# Patient Record
Sex: Male | Born: 1975 | Race: Black or African American | Hispanic: No | Smoking: Current every day smoker
Health system: Southern US, Community
[De-identification: ages and names within clinical notes are randomized; demographics above are authoritative.]

## PROBLEM LIST (undated history)

## (undated) DIAGNOSIS — K649 Unspecified hemorrhoids: Secondary | ICD-10-CM

## (undated) DIAGNOSIS — K219 Gastro-esophageal reflux disease without esophagitis: Secondary | ICD-10-CM

---

## 2014-05-26 ENCOUNTER — Emergency Department: Payer: Self-pay | Admitting: Emergency Medicine

## 2014-08-01 ENCOUNTER — Emergency Department: Payer: Self-pay | Admitting: Emergency Medicine

## 2014-08-02 ENCOUNTER — Emergency Department: Payer: Self-pay | Admitting: Emergency Medicine

## 2014-08-15 DIAGNOSIS — T24299A Burn of second degree of multiple sites of unspecified lower limb, except ankle and foot, initial encounter: Secondary | ICD-10-CM | POA: Insufficient documentation

## 2015-01-03 ENCOUNTER — Emergency Department: Payer: Self-pay | Admitting: Emergency Medicine

## 2015-01-03 LAB — CBC WITH DIFFERENTIAL/PLATELET
BASOS ABS: 0.1 10*3/uL (ref 0.0–0.1)
Basophil %: 1.2 %
EOS ABS: 0.1 10*3/uL (ref 0.0–0.7)
Eosinophil %: 1.2 %
HCT: 51.8 % (ref 40.0–52.0)
HGB: 16.7 g/dL (ref 13.0–18.0)
LYMPHS ABS: 3.5 10*3/uL (ref 1.0–3.6)
Lymphocyte %: 30.3 %
MCH: 30.2 pg (ref 26.0–34.0)
MCHC: 32.3 g/dL (ref 32.0–36.0)
MCV: 94 fL (ref 80–100)
MONO ABS: 1.2 x10 3/mm — AB (ref 0.2–1.0)
Monocyte %: 10.4 %
NEUTROS PCT: 56.9 %
Neutrophil #: 6.6 10*3/uL — ABNORMAL HIGH (ref 1.4–6.5)
Platelet: 224 10*3/uL (ref 150–440)
RBC: 5.53 10*6/uL (ref 4.40–5.90)
RDW: 13.8 % (ref 11.5–14.5)
WBC: 11.6 10*3/uL — ABNORMAL HIGH (ref 3.8–10.6)

## 2015-01-03 LAB — COMPREHENSIVE METABOLIC PANEL
ALBUMIN: 3.4 g/dL (ref 3.4–5.0)
ALT: 31 U/L
Alkaline Phosphatase: 57 U/L
Anion Gap: 6 — ABNORMAL LOW (ref 7–16)
BILIRUBIN TOTAL: 0.8 mg/dL (ref 0.2–1.0)
BUN: 9 mg/dL (ref 7–18)
CO2: 27 mmol/L (ref 21–32)
CREATININE: 1.43 mg/dL — AB (ref 0.60–1.30)
Calcium, Total: 9 mg/dL (ref 8.5–10.1)
Chloride: 107 mmol/L (ref 98–107)
EGFR (African American): >60
GFR CALC NON AF AMER: 59 — AB
Glucose: 87 mg/dL (ref 65–99)
OSMOLALITY: 277 (ref 275–301)
Potassium: 3.4 mmol/L — ABNORMAL LOW (ref 3.5–5.1)
SGOT(AST): 52 U/L — ABNORMAL HIGH (ref 15–37)
SODIUM: 140 mmol/L (ref 136–145)
TOTAL PROTEIN: 7.3 g/dL (ref 6.4–8.2)

## 2015-02-22 DIAGNOSIS — Z8719 Personal history of other diseases of the digestive system: Secondary | ICD-10-CM | POA: Insufficient documentation

## 2015-03-10 ENCOUNTER — Ambulatory Visit: Payer: Self-pay | Admitting: Unknown Physician Specialty

## 2015-03-20 DIAGNOSIS — K3189 Other diseases of stomach and duodenum: Secondary | ICD-10-CM | POA: Insufficient documentation

## 2015-03-20 DIAGNOSIS — K2980 Duodenitis without bleeding: Secondary | ICD-10-CM | POA: Insufficient documentation

## 2015-04-24 LAB — SURGICAL PATHOLOGY

## 2015-08-01 ENCOUNTER — Emergency Department
Admission: EM | Admit: 2015-08-01 | Discharge: 2015-08-01 | Disposition: A | Discharge status: Home / Self Care | Payer: Medicare Other | Attending: Emergency Medicine | Admitting: Emergency Medicine

## 2015-08-01 ENCOUNTER — Emergency Department: Payer: Medicare Other

## 2015-08-01 DIAGNOSIS — Z720 Tobacco use: Secondary | ICD-10-CM | POA: Diagnosis not present

## 2015-08-01 DIAGNOSIS — Y9389 Activity, other specified: Secondary | ICD-10-CM | POA: Insufficient documentation

## 2015-08-01 DIAGNOSIS — Y998 Other external cause status: Secondary | ICD-10-CM | POA: Diagnosis not present

## 2015-08-01 DIAGNOSIS — S9782XA Crushing injury of left foot, initial encounter: Secondary | ICD-10-CM | POA: Insufficient documentation

## 2015-08-01 DIAGNOSIS — W208XXA Other cause of strike by thrown, projected or falling object, initial encounter: Secondary | ICD-10-CM | POA: Insufficient documentation

## 2015-08-01 DIAGNOSIS — Y9289 Other specified places as the place of occurrence of the external cause: Secondary | ICD-10-CM | POA: Insufficient documentation

## 2015-08-01 DIAGNOSIS — M7752 Other enthesopathy of left foot: Secondary | ICD-10-CM | POA: Diagnosis not present

## 2015-08-01 DIAGNOSIS — M779 Enthesopathy, unspecified: Secondary | ICD-10-CM

## 2015-08-01 DIAGNOSIS — S99922A Unspecified injury of left foot, initial encounter: Secondary | ICD-10-CM | POA: Diagnosis present

## 2015-08-01 MED ORDER — MELOXICAM 15 MG PO TABS: 15.0000 mg | ORAL_TABLET | Freq: Every day | ORAL | Status: DC

## 2015-08-01 MED ORDER — HYDROCODONE-ACETAMINOPHEN 5-325 MG PO TABS: 1.0000 | ORAL_TABLET | ORAL | Status: DC | PRN

## 2015-08-01 NOTE — ED Notes (Signed)
Pt reports to ED w/ c/o foot pain.  Pt sts he dropped dryer on foot today.  Pt able to ambulate to triage room.  Pt also c/o hand cramping.

## 2015-08-01 NOTE — ED Provider Notes (Signed)
[facility] Emergency Department Provider Note ____________________________________________  Time seen: Approximately 6:25 PM  I have reviewed the triage vital signs and the nursing notes.   HISTORY  Chief Complaint Foot Injury   HPI Peter Kim is a 39 y.o. male who presents to the urgency department for evaluation of left foot pain after a friend accidentally dropped a washer while they were moving it. He also reports concerns because he is having muscle cramps and stiffness in his hands and legs most pronounced when he wakes up in the mornings. He reports that he started a new job about 3 weeks ago and is making repetitive motions. He has not been taking anything at home for pain.   History reviewed. No pertinent past medical history.  There are no active problems to display for this patient.   History reviewed. No pertinent past surgical history.  No current outpatient prescriptions on file.  Allergies Review of patient's allergies indicates no known allergies.  No family history on file.  Social History History  Substance Use Topics  . Smoking status: Current Every Day Smoker -- 0.50 packs/day    Types: Cigarettes  . Smokeless tobacco: Not on file  . Alcohol Use: Yes     Comment: occassionally     Review of Systems Constitutional: No recent illness. Eyes: No visual changes. ENT: No sore throat. Cardiovascular: Denies chest pain or palpitations. Respiratory: Denies shortness of breath. Gastrointestinal: No abdominal pain.  Genitourinary: Negative for dysuria. Musculoskeletal: Pain in left foot, bilateral hands. Skin: Negative for rash. Neurological: Negative for headaches, focal weakness or numbness. 10-point ROS otherwise negative.  ____________________________________________   PHYSICAL EXAM:  VITAL SIGNS: ED Triage Vitals  Enc Vitals Group     BP 08/01/15 1720 134/89 mmHg     Pulse Rate 08/01/15 1720 86     Resp 08/01/15  1720 14     Temp 08/01/15 1720 97.9 F (36.6 C)     Temp Source 08/01/15 1720 Oral     SpO2 08/01/15 1720 98 %     Weight 08/01/15 1720 230 lb (104.327 kg)     Height 08/01/15 1720  (1.854 m)     Head Cir --      Peak Flow --      Pain Score 08/01/15 1721 8     Pain Loc --      Pain Edu? --      Excl. in GC? --     Constitutional: Alert and oriented. Well appearing and in no acute distress. Eyes: Conjunctivae are normal. EOMI. Head: Atraumatic. Nose: No congestion/rhinnorhea. Neck: No stridor.  Respiratory: Normal respiratory effort.   Musculoskeletal: Erythema noted to the top of the left foot. Full range of motion possible but somewhat limited due to pain. Full range of motion noted to bilateral hands. Neurologic:  Normal speech and language. No gross focal neurologic deficits are appreciated. Speech is normal. No gait instability. Skin:  Skin is warm, dry and intact. Small abrasion noted to the left ankle. Psychiatric: Mood and affect are normal. Speech and behavior are normal.  ____________________________________________   LABS (all labs ordered are listed, but only abnormal results are displayed)  Labs Reviewed - No data to display ____________________________________________  RADIOLOGY  Negative for acute pathology. ____________________________________________   PROCEDURES  Procedure(s) performed: Ace bandage applied to the left foot by RN.   ____________________________________________   INITIAL IMPRESSION / ASSESSMENT AND PLAN / ED COURSE  Pertinent labs & imaging results that were  available during my care of the patient were reviewed by me and considered in my medical decision making (see chart for details).  Patient was advised to follow-up with orthopedics for symptoms that are not improving over the next few days. He was advised to take meloxicam daily to help with the joint stiffness. He is advised to return to the emergency department for  symptoms that change or worsen if he is unable schedule an appointment. ____________________________________________   FINAL CLINICAL IMPRESSION(S) / ED DIAGNOSES  Final diagnoses:  None       Chinita Pester, FNP 08/01/15 1839  Minna Antis, MD 08/01/15 2229

## 2015-08-09 ENCOUNTER — Emergency Department
Admission: EM | Admit: 2015-08-09 | Discharge: 2015-08-09 | Disposition: A | Discharge status: Home / Self Care | Payer: Medicare Other | Attending: Emergency Medicine | Admitting: Emergency Medicine

## 2015-08-09 ENCOUNTER — Emergency Department: Payer: Medicare Other

## 2015-08-09 DIAGNOSIS — M79642 Pain in left hand: Secondary | ICD-10-CM | POA: Insufficient documentation

## 2015-08-09 DIAGNOSIS — Z791 Long term (current) use of non-steroidal anti-inflammatories (NSAID): Secondary | ICD-10-CM | POA: Diagnosis not present

## 2015-08-09 DIAGNOSIS — Z720 Tobacco use: Secondary | ICD-10-CM | POA: Insufficient documentation

## 2015-08-09 MED ORDER — PREDNISONE 10 MG PO TABS: ORAL_TABLET | ORAL | Status: DC

## 2015-08-09 MED ORDER — OXYCODONE-ACETAMINOPHEN 5-325 MG PO TABS: 1.0000 | ORAL_TABLET | ORAL | Status: DC | PRN

## 2015-08-09 MED ORDER — METHOCARBAMOL 500 MG PO TABS: 500.0000 mg | ORAL_TABLET | Freq: Four times a day (QID) | ORAL | Status: DC | PRN

## 2015-08-09 MED ORDER — OXYCODONE-ACETAMINOPHEN 5-325 MG PO TABS
2.0000 | ORAL_TABLET | Freq: Once | ORAL | Status: AC
  Administered 2015-08-09: 2 via ORAL
  Filled 2015-08-09: qty 2

## 2015-08-09 NOTE — Discharge Instructions (Signed)

## 2015-08-09 NOTE — ED Notes (Signed)
Pt with pain to right hand for over a week, seen here last week for the same and dx with tendonitis.  Given meloxicam without any relief.

## 2015-08-09 NOTE — ED Provider Notes (Signed)
[facility] Emergency Department Provider Note  ____________________________________________  Time seen: Approximately 7:48 PM  I have reviewed the triage vital signs and the nursing notes.   HISTORY  Chief Complaint Hand Pain    HPI Peter Kim is a 39 y.o. male presents for evaluation of continued right hand pain times several weeks. Patient was seen here on 08/01/2015 for the same. 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  No past medical history on file.  There are no active problems to display for this patient.   No past surgical history on file.  Current Outpatient Rx  Name  Route  Sig  Dispense  Refill  . meloxicam (MOBIC) 15 MG tablet   Oral   Take 1 tablet (15 mg total) by mouth daily.   30 tablet   2   . methocarbamol (ROBAXIN) 500 MG tablet   Oral   Take 1 tablet (500 mg total) by mouth every 6 (six) hours as needed for muscle spasms.   30 tablet   0   . oxyCODONE-acetaminophen (ROXICET) 5-325 MG per tablet   Oral   Take 1-2 tablets by mouth every 4 (four) hours as needed for severe pain.   15 tablet   0   . predniSONE (DELTASONE) 10 MG tablet      Take 5 pills daily for 5 days.   25 tablet   0     Allergies Review of patient's allergies indicates no known allergies.  No family history on file.  Social History Social History  Substance Use Topics  . Smoking status: Current Every Day Smoker -- 0.50 packs/day    Types: Cigarettes  . Smokeless tobacco: Not on file  . Alcohol Use: Yes     Comment: occassionally     Review of Systems Constitutional: No fever/chills Eyes: No visual changes. ENT: No sore throat. Cardiovascular: Denies chest pain. Respiratory: Denies shortness of breath. Gastrointestinal: No abdominal pain.  No nausea, no vomiting.  No diarrhea.  No constipation. Genitourinary: Negative for dysuria. Musculoskeletal: Positive  for right hand pain. Skin: Negative for rash. Neurological: Negative for headaches, focal weakness or numbness.  10-point ROS otherwise negative.  ____________________________________________   PHYSICAL EXAM:  VITAL SIGNS: ED Triage Vitals  Enc Vitals Group     BP 08/09/15 1937 155/92 mmHg     Pulse Rate 08/09/15 1937 89     Resp 08/09/15 1937 18     Temp 08/09/15 1937 98.4 F (36.9 C)     Temp src --      SpO2 --      Weight 08/09/15 1937 230 lb (104.327 kg)     Height 08/09/15 1937  (1.854 m)     Head Cir --      Peak Flow --      Pain Score 08/09/15 1938 10     Pain Loc --      Pain Edu? --      Excl. in GC? --     Constitutional: Alert and oriented. Well appearing and in no acute distress. Neck: No stridor.   Cardiovascular: Normal rate, regular rhythm. Grossly normal heart sounds.  Good peripheral circulation. Respiratory: Normal respiratory effort.  No retractions. Lungs CTAB. Musculoskeletal: Obvious right hand edema with tenderness. Range of motion of the thumb in flexion position. Decreased strength noted. Neurologic:  Normal speech and language. No gross focal neurologic deficits are appreciated. No gait instability. Skin:  Skin is warm, dry and intact. No rash noted. Psychiatric: Mood and affect are normal. Speech and behavior are normal.  ____________________________________________   LABS (all labs ordered are listed, but only abnormal results are displayed)  Labs Reviewed - No data to display ____________________________________________  RADIOLOGY  X-rays unremarkable interpreted by radiologist and reviewed by myself. ____________________________________________   PROCEDURES  Procedure(s) performed: None  Critical Care performed: No  ____________________________________________   INITIAL IMPRESSION / ASSESSMENT AND PLAN / ED COURSE  Pertinent labs & imaging results that were available during my care of the patient were reviewed by me  and considered in my medical decision making (see chart for details).  Ace wrap to right hand. Rx given for naproxen 500 mg, Robaxin 500 mg prednisone 50 mg daily 5 days and Percocet for severe pain only. Patient follow-up with Dr. Hyacinth Meeker in orthopedics if continued pain. Patient voices no other emergency medical complaints at this time. ____________________________________________   FINAL CLINICAL IMPRESSION(S) / ED DIAGNOSES  Final diagnoses:  Hand pain, left      Evangeline Dakin, PA-C 08/09/15 2029  Sharman Cheek, MD 08/09/15 2244

## 2015-10-28 ENCOUNTER — Encounter: Payer: Self-pay | Admitting: Emergency Medicine

## 2015-10-28 ENCOUNTER — Emergency Department
Admission: EM | Admit: 2015-10-28 | Discharge: 2015-10-28 | Disposition: A | Discharge status: Home / Self Care | Payer: Medicare Other | Attending: Emergency Medicine | Admitting: Emergency Medicine

## 2015-10-28 DIAGNOSIS — K5900 Constipation, unspecified: Secondary | ICD-10-CM | POA: Diagnosis not present

## 2015-10-28 DIAGNOSIS — K641 Second degree hemorrhoids: Secondary | ICD-10-CM | POA: Insufficient documentation

## 2015-10-28 DIAGNOSIS — Z791 Long term (current) use of non-steroidal anti-inflammatories (NSAID): Secondary | ICD-10-CM | POA: Diagnosis not present

## 2015-10-28 DIAGNOSIS — Z720 Tobacco use: Secondary | ICD-10-CM | POA: Insufficient documentation

## 2015-10-28 DIAGNOSIS — K6289 Other specified diseases of anus and rectum: Secondary | ICD-10-CM | POA: Diagnosis present

## 2015-10-28 HISTORY — DX: Unspecified hemorrhoids: K64.9

## 2015-10-28 MED ORDER — OXYCODONE-ACETAMINOPHEN 7.5-325 MG PO TABS: 1.0000 | ORAL_TABLET | ORAL | Status: DC | PRN

## 2015-10-28 MED ORDER — HYDROCORTISONE ACETATE 25 MG RE SUPP: 25.0000 mg | Freq: Two times a day (BID) | RECTAL | Status: DC

## 2015-10-28 MED ORDER — HYDROCORTISONE ACETATE 25 MG RE SUPP
25.0000 mg | Freq: Once | RECTAL | Status: AC
  Administered 2015-10-28: 25 mg via RECTAL
  Filled 2015-10-28: qty 1

## 2015-10-28 MED ORDER — OXYCODONE-ACETAMINOPHEN 5-325 MG PO TABS
1.0000 | ORAL_TABLET | Freq: Once | ORAL | Status: AC
  Administered 2015-10-28: 1 via ORAL
  Filled 2015-10-28: qty 1

## 2015-10-28 NOTE — Discharge Instructions (Signed)
Hemorrhoids °Hemorrhoids are swollen veins around the rectum or anus. There are two types of hemorrhoids:  °· Internal hemorrhoids. These occur in the veins just inside the rectum. They may poke through to the outside and become irritated and painful. °· External hemorrhoids. These occur in the veins outside the anus and can be felt as a painful swelling or hard lump near the anus. °CAUSES °· Pregnancy.   °· Obesity.   °· Constipation or diarrhea.   °· Straining to have a bowel movement.   °· Sitting for long periods on the toilet. °· Heavy lifting or other activity that caused you to strain. °· Anal intercourse. °SYMPTOMS  °· Pain.   °· Anal itching or irritation.   °· Rectal bleeding.   °· Fecal leakage.   °· Anal swelling.   °· One or more lumps around the anus.   °DIAGNOSIS  °Your caregiver may be able to diagnose hemorrhoids by visual examination. Other examinations or tests that may be performed include:  °· Examination of the rectal area with a gloved hand (digital rectal exam).   °· Examination of anal canal using a small tube (scope).   °· A blood test if you have lost a significant amount of blood. °· A test to look inside the colon (sigmoidoscopy or colonoscopy). °TREATMENT °Most hemorrhoids can be treated at home. However, if symptoms do not seem to be getting better or if you have a lot of rectal bleeding, your caregiver may perform a procedure to help make the hemorrhoids get smaller or remove them completely. Possible treatments include:  °· Placing a rubber band at the base of the hemorrhoid to cut off the circulation (rubber band ligation).   °· Injecting a chemical to shrink the hemorrhoid (sclerotherapy).   °· Using a tool to burn the hemorrhoid (infrared light therapy).   °· Surgically removing the hemorrhoid (hemorrhoidectomy).   °· Stapling the hemorrhoid to block blood flow to the tissue (hemorrhoid stapling).   °HOME CARE INSTRUCTIONS  °· Eat foods with fiber, such as whole grains, beans,  nuts, fruits, and vegetables. Ask your doctor about taking products with added fiber in them (fiber supplements). °· Increase fluid intake. Drink enough water and fluids to keep your urine clear or pale yellow.   °· Exercise regularly.   °· Go to the bathroom when you have the urge to have a bowel movement. Do not wait.   °· Avoid straining to have bowel movements.   °· Keep the anal area dry and clean. Use wet toilet paper or moist towelettes after a bowel movement.   °· Medicated creams and suppositories may be used or applied as directed.   °· Only take over-the-counter or prescription medicines as directed by your caregiver.   °· Take warm sitz baths for 15-20 minutes, 3-4 times a day to ease pain and discomfort.   °· Place ice packs on the hemorrhoids if they are tender and swollen. Using ice packs between sitz baths may be helpful.   °¨ Put ice in a plastic bag.   °¨ Place a towel between your skin and the bag.   °¨ Leave the ice on for 15-20 minutes, 3-4 times a day.   °· Do not use a donut-shaped pillow or sit on the toilet for long periods. This increases blood pooling and pain.   °SEEK MEDICAL CARE IF: °· You have increasing pain and swelling that is not controlled by treatment or medicine. °· You have uncontrolled bleeding. °· You have difficulty or you are unable to have a bowel movement. °· You have pain or inflammation outside the area of the hemorrhoids. °MAKE SURE YOU: °· Understand these instructions. °·   Will watch your condition.  Will get help right away if you are not doing well or get worse.   This information is not intended to replace advice given to you by your health care provider. Make sure you discuss any questions you have with your health care provider.   Document Released: 12/13/2000 Document Revised: 12/02/2012 Document Reviewed: 10/20/2012 Elsevier Interactive Patient Education 2016 [employer]   Continue pain medicine and rectal suppository as directed.   If not  improving, contact your primary physician or the surgeon. Return to the emergency room for any concerns.

## 2015-10-28 NOTE — ED Notes (Signed)
C/o burning pain to rectum x 2 days.  States he sees blood when wiping.  Has been using preparation H and taking Ibuprofen for pain with no relief.  Does have history of hemorrhoids, but these feel worse.

## 2015-10-29 NOTE — ED Provider Notes (Signed)
[facility] Emergency Department Provider Note  ____________________________________________  Time seen: Approximately 12:31 AM  I have reviewed the triage vital signs and the nursing notes.   HISTORY  Chief Complaint Hemorrhoids and Rectal Pain    HPI Darryn Kydd is a 39 y.o. male with rectal pain for 2 days. He has had to strain due to constipation. No abdominal pain. He has a history of reflux.  he has seenblood on tissue when wiping. No lightheadedness.   Past Medical History  Diagnosis Date  . Hemorrhoid     There are no active problems to display for this patient.   History reviewed. No pertinent past surgical history.  Current Outpatient Rx  Name  Route  Sig  Dispense  Refill  . hydrocortisone (ANUSOL-HC) 25 MG suppository   Rectal   Place 1 suppository (25 mg total) rectally every 12 (twelve) hours.   12 suppository   1   . meloxicam (MOBIC) 15 MG tablet   Oral   Take 1 tablet (15 mg total) by mouth daily.   30 tablet   2   . methocarbamol (ROBAXIN) 500 MG tablet   Oral   Take 1 tablet (500 mg total) by mouth every 6 (six) hours as needed for muscle spasms.   30 tablet   0   . oxyCODONE-acetaminophen (PERCOCET) 7.5-325 MG tablet   Oral   Take 1 tablet by mouth every 4 (four) hours as needed for severe pain.   20 tablet   0   . oxyCODONE-acetaminophen (ROXICET) 5-325 MG per tablet   Oral   Take 1-2 tablets by mouth every 4 (four) hours as needed for severe pain.   15 tablet   0   . predniSONE (DELTASONE) 10 MG tablet      Take 5 pills daily for 5 days.   25 tablet   0     Allergies Review of patient's allergies indicates no known allergies.  No family history on file.  Social History Social History  Substance Use Topics  . Smoking status: Current Every Day Smoker -- 0.50 packs/day    Types: Cigarettes  . Smokeless tobacco: None  . Alcohol Use: Yes     Comment: occassionally     Review of  Systems  Constitutional: No fever/chills Eyes: No visual changes. ENT: No sore throat. Cardiovascular: Denies chest pain. Respiratory: Denies shortness of breath. Gastrointestinal: No abdominal pain.  No nausea, no vomiting.  No diarrhea.  Positive for constipation. Genitourinary: Negative for dysuria. Musculoskeletal: Negative for back pain. Skin: Negative for rash. Neurological: Negative for headaches, focal weakness or numbness.  10-point ROS otherwise negative.  ____________________________________________   PHYSICAL EXAM:  VITAL SIGNS: ED Triage Vitals  Enc Vitals Group     BP 10/28/15 2058 137/96 mmHg     Pulse Rate 10/28/15 2058 92     Resp 10/28/15 2058 18     Temp 10/28/15 2058 97.5 F (36.4 C)     Temp Source 10/28/15 2058 Oral     SpO2 10/28/15 2058 97 %     Weight 10/28/15 2058 230 lb (104.327 kg)     Height 10/28/15 2058  (1.854 m)     Head Cir --      Peak Flow --      Pain Score 10/28/15 2117 10     Pain Loc --      Pain Edu? --      Excl. in GC? --     Constitutional:  Alert and oriented. Well appearing and in no acute distress. Eyes: Conjunctivae are normal. PERRL. EOMI. Head: Atraumatic. Nose: No congestion/rhinnorhea. Mouth/Throat: Mucous membranes are moist.  Oropharynx non-erythematous. Neck: No stridor.  Cardiovascular: Normal rate, regular rhythm. Grossly normal heart sounds.  Good peripheral circulation. Respiratory: Normal respiratory effort.  No retractions. Lungs CTAB. Gastrointestinal: Soft and nontender. No distention. No abdominal bruits. No CVA tenderness. RECTUM:  Hemorrhoid noted, non thrombosed at about 9 oclock. Musculoskeletal: No lower extremity tenderness nor edema.  No joint effusions. Neurologic:  Normal speech and language. No gross focal neurologic deficits are appreciated. No gait instability. Skin:  Skin is warm, dry and intact. No rash noted. Psychiatric: Mood and affect are normal. Speech and behavior are  normal.  ____________________________________________   LABS (all labs ordered are listed, but only abnormal results are displayed)  Labs Reviewed - No data to display ____________________________________________  EKG   ____________________________________________  RADIOLOGY   ____________________________________________   PROCEDURES  Procedure(s) performed: None  Critical Care performed: No  ____________________________________________   INITIAL IMPRESSION / ASSESSMENT AND PLAN / ED COURSE  Pertinent labs & imaging results that were available during my care of the patient were reviewed by me and considered in my medical decision making (see chart for details).  39 year old male with tender hemorrhoid, nonthrombosed. Given Anusol HC suppositories and Percocet for pain control. Work note given. He can follow-up with primary physician if not improving or contacting the surgeon. He will return to the emergency room for any concerns. ____________________________________________   FINAL CLINICAL IMPRESSION(S) / ED DIAGNOSES  Final diagnoses:  Second degree hemorrhoids      Ignacia Bayleyobert Renate Danh, PA-C 10/29/15 [id number]  Jene Everyobert Kinner, MD 10/29/15 [phone]

## 2015-11-14 ENCOUNTER — Emergency Department: Payer: Medicare Other

## 2015-11-14 ENCOUNTER — Emergency Department
Admission: EM | Admit: 2015-11-14 | Discharge: 2015-11-14 | Disposition: A | Discharge status: Home / Self Care | Payer: Medicare Other | Attending: Emergency Medicine | Admitting: Emergency Medicine

## 2015-11-14 ENCOUNTER — Encounter: Payer: Self-pay | Admitting: *Deleted

## 2015-11-14 DIAGNOSIS — W108XXA Fall (on) (from) other stairs and steps, initial encounter: Secondary | ICD-10-CM | POA: Diagnosis not present

## 2015-11-14 DIAGNOSIS — Y9389 Activity, other specified: Secondary | ICD-10-CM | POA: Insufficient documentation

## 2015-11-14 DIAGNOSIS — Y998 Other external cause status: Secondary | ICD-10-CM | POA: Diagnosis not present

## 2015-11-14 DIAGNOSIS — Z7952 Long term (current) use of systemic steroids: Secondary | ICD-10-CM | POA: Diagnosis not present

## 2015-11-14 DIAGNOSIS — S8001XA Contusion of right knee, initial encounter: Secondary | ICD-10-CM | POA: Insufficient documentation

## 2015-11-14 DIAGNOSIS — Z79899 Other long term (current) drug therapy: Secondary | ICD-10-CM | POA: Diagnosis not present

## 2015-11-14 DIAGNOSIS — Y9289 Other specified places as the place of occurrence of the external cause: Secondary | ICD-10-CM | POA: Diagnosis not present

## 2015-11-14 DIAGNOSIS — S8991XA Unspecified injury of right lower leg, initial encounter: Secondary | ICD-10-CM | POA: Diagnosis present

## 2015-11-14 DIAGNOSIS — F17210 Nicotine dependence, cigarettes, uncomplicated: Secondary | ICD-10-CM | POA: Diagnosis not present

## 2015-11-14 DIAGNOSIS — S80811A Abrasion, right lower leg, initial encounter: Secondary | ICD-10-CM | POA: Insufficient documentation

## 2015-11-14 MED ORDER — MELOXICAM 15 MG PO TABS: 15.0000 mg | ORAL_TABLET | Freq: Every day | ORAL | Status: DC

## 2015-11-14 NOTE — Discharge Instructions (Signed)
Periosteal Hematoma Periosteal hematoma (bone bruise) is a localized, tender, raised area close to the bone. It can occur from a small hidden fracture of the bone, following surgery, or from other trauma to the area. It typically occurs in bones located close to the surface of the skin, such as the shin, knee, and heel bone. Although it may take 2 or more weeks to completely heal, bone bruises typically are not associated with permanent or serious damage to the bone. If you are taking blood thinners, you may be at greater risk for such injuries.  CAUSES  A bone bruise is usually caused by high-impact trauma to the bone, but it can be caused by sports injuries or twisting injuries. SIGNS AND SYMPTOMS   Severe pain around the injured area that typically lasts longer than a normal bruise.  Difficulty using the bruised area.  Tender, raised area close to the bone.  Discoloration or swelling of the bruised area. DIAGNOSIS  You may need an MRI of the injured area to confirm a bone bruise if your health care provider feels it is necessary. A regular X-ray will not detect a bone bruise, but it will detect a broken bone (fracture). An X-ray may be taken to rule out any fractures. TREATMENT  Often, the best treatment for a bone bruise is resting, icing, and applying cold compresses to the injured area. Over-the-counter medicines may also be recommended for pain control. HOME CARE INSTRUCTIONS  Some things you can do to improve the condition are:   Rest and elevate the area of injury as long as it is very tender or swollen.  Apply ice to the injured area:  Put ice in a plastic bag.  Place a towel between your skin and the bag.  Leave the ice on for 20 minutes, 2-3 times a day.  Use an elastic wrap to reduce swelling and protect the injured area. Make sure it is not applied too tightly. If the area around the wrap becomes cold or blue, the wrap is too tight. Wrap it more loosely.  For  activity:  Follow your health care provider's instructions about whether walking with crutches is required. This will depend on how serious your condition is.  Start weight bearing gradually on the bruised part.  Continue to use crutches or a cane until you can stand without causing pain, or as instructed.  If a plaster splint was applied:  Wear the splint until you are seen for a follow-up exam.  Rest it on nothing harder than a pillow the first 24 hours.  Do not put weight on it.  Do not get it wet. You may take it off to take a shower or bath.  You may have been given an elastic bandage to use with or without the plaster splint. The splint is too tight if you have numbness or tingling, or if the skin around the bandage becomes cold and blue. Adjust the bandage to make it comfortable.  If an air splint was applied:  You may alter the amount of air in the splint as needed for comfort.  You may take it off at night and to take a shower or bath.  If the injury was in either leg, wiggle your toes in the splint several times per day if you are able.  Only take over-the-counter or prescription medicines for pain, discomfort, or fever as directed by your health care provider.  Keep all follow-up visits with your health care provider. This includes  any orthopedic referrals, physical therapy, and rehabilitation. Any delay in getting necessary care could result in a delay or failure of the bones to heal. SEEK MEDICAL CARE IF:   You have an increase in bruising, swelling, tenderness, heat, or pain over your injury.  You notice coldness of your toes that does not improve after removing a splint or bandage.  Your pain is not lessened after you take medicine.  You have increased difficulty bearing weight on the injured leg, if the injury is in either leg. SEEK IMMEDIATE MEDICAL CARE IF:   You have severe pain near the injured area or severe pain with stretching.  You have increased  swelling that resulted in a tense, hard area or a loss of sensation in the area of the injury.  You have pale, cool skin below the area of the injury (in an extremity) that does not go away after removing a splint or bandage. MAKE SURE YOU:   Understand these instructions.  Will watch your condition.  Will get help right away if you are not doing well or get worse.   This information is not intended to replace advice given to you by your health care provider. Make sure you discuss any questions you have with your health care provider.   Document Released: 01/23/2005 Document Revised: 10/06/2013 Document Reviewed: 06/04/2013 Elsevier Interactive Patient Education 2016 [employer]  Abrasion An abrasion is a cut or scrape on the outer surface of your skin. An abrasion does not extend through all of the layers of your skin. It is important to care for your abrasion properly to prevent infection. CAUSES Most abrasions are caused by falling on or gliding across the ground or another surface. When your skin rubs on something, the outer and inner layer of skin rubs off.  SYMPTOMS A cut or scrape is the main symptom of this condition. The scrape may be bleeding, or it may appear red or pink. If there was an associated fall, there may be an underlying bruise. DIAGNOSIS An abrasion is diagnosed with a physical exam. TREATMENT Treatment for this condition depends on how large and deep the abrasion is. Usually, your abrasion will be cleaned with water and mild soap. This removes any dirt or debris that may be stuck. An antibiotic ointment may be applied to the abrasion to help prevent infection. A bandage (dressing) may be placed on the abrasion to keep it clean. You may also need a tetanus shot. HOME CARE INSTRUCTIONS Medicines  Take or apply medicines only as directed by your health care provider.  If you were prescribed an antibiotic ointment, finish all of it even if you start to feel  better. Wound Care  Clean the wound with mild soap and water 2-3 times per day or as directed by your health care provider. Pat your wound dry with a clean towel. Do not rub it.  There are many different ways to close and cover a wound. Follow instructions from your health care provider about:  Wound care.  Dressing changes and removal.  Check your wound every day for signs of infection. Watch for:  Redness, swelling, or pain.  Fluid, blood, or pus. General Instructions  Keep the dressing dry as directed by your health care provider. Do not take baths, swim, use a hot tub, or do anything that would put your wound underwater until your health care provider approves.  If there is swelling, raise (elevate) the injured area above the level of your heart while  you are sitting or lying down.  Keep all follow-up visits as directed by your health care provider. This is important. SEEK MEDICAL CARE IF:  You received a tetanus shot and you have swelling, severe pain, redness, or bleeding at the injection site.  Your pain is not controlled with medicine.  You have increased redness, swelling, or pain at the site of your wound. SEEK IMMEDIATE MEDICAL CARE IF:  You have a red streak going away from your wound.  You have a fever.  You have fluid, blood, or pus coming from your wound.  You notice a bad smell coming from your wound or your dressing.   This information is not intended to replace advice given to you by your health care provider. Make sure you discuss any questions you have with your health care provider.   Document Released: 09/25/2005 Document Revised: 09/06/2015 Document Reviewed: 12/14/2014 Elsevier Interactive Patient Education [employer]

## 2015-11-14 NOTE — ED Provider Notes (Signed)
[facility] Emergency Department Provider Note  ____________________________________________  Time seen: Approximately 4:11 PM  I have reviewed the triage vital signs and the nursing notes.   HISTORY  Chief Complaint Knee Injury    HPI Peter Kim is a 39 y.o. male who reports to the emergency department complaining of right knee pain. He states that he was helping a friend move a dryer up a flight of stairs when he "missed a step" hitting his shin and landing on his right knee. He states that the time he was on the weightbearing side of the dryer and both landed with his weight and the dryer weight combined. He states that he initially tried to treat symptoms with Tylenol and Aleve and states that there is minimal relief from same. He reports the pain is an ache/sharp sensation primarily in the anterior aspect of his knee. He denies any symptoms to ankle or hip. He denies any low back pain. He states the pain is constant, worse with movement and weightbearing.   Past Medical History  Diagnosis Date  . Hemorrhoid     There are no active problems to display for this patient.   No past surgical history on file.  Current Outpatient Rx  Name  Route  Sig  Dispense  Refill  . hydrocortisone (ANUSOL-HC) 25 MG suppository   Rectal   Place 1 suppository (25 mg total) rectally every 12 (twelve) hours.   12 suppository   1   . meloxicam (MOBIC) 15 MG tablet   Oral   Take 1 tablet (15 mg total) by mouth daily.   30 tablet   0   . methocarbamol (ROBAXIN) 500 MG tablet   Oral   Take 1 tablet (500 mg total) by mouth every 6 (six) hours as needed for muscle spasms.   30 tablet   0   . oxyCODONE-acetaminophen (PERCOCET) 7.5-325 MG tablet   Oral   Take 1 tablet by mouth every 4 (four) hours as needed for severe pain.   20 tablet   0   . oxyCODONE-acetaminophen (ROXICET) 5-325 MG per tablet   Oral   Take 1-2 tablets by mouth every 4 (four) hours as needed  for severe pain.   15 tablet   0   . predniSONE (DELTASONE) 10 MG tablet      Take 5 pills daily for 5 days.   25 tablet   0     Allergies Review of patient's allergies indicates no known allergies.  No family history on file.  Social History Social History  Substance Use Topics  . Smoking status: Current Every Day Smoker -- 0.50 packs/day    Types: Cigarettes  . Smokeless tobacco: None  . Alcohol Use: Yes     Comment: occassionally     Review of Systems Constitutional: No fever/chills Eyes: No visual changes. ENT: No sore throat. Cardiovascular: Denies chest pain. Respiratory: Denies shortness of breath. Gastrointestinal: No abdominal pain.  No nausea, no vomiting.  No diarrhea.  No constipation. Genitourinary: Negative for dysuria. Musculoskeletal: Negative for back pain. Endorses right knee pain. Skin: Negative for rash. Neurological: Negative for headaches, focal weakness or numbness.  10-point ROS otherwise negative.  ____________________________________________   PHYSICAL EXAM:  VITAL SIGNS: ED Triage Vitals  Enc Vitals Group     BP 11/14/15 1603 117/93 mmHg     Pulse Rate 11/14/15 1603 104     Resp 11/14/15 1603 20     Temp 11/14/15 1603 98.8 F (37.1  C)     Temp Source 11/14/15 1603 Oral     SpO2 11/14/15 1603 99 %     Weight 11/14/15 1603 230 lb (104.327 kg)     Height 11/14/15 1603 6\' 1"  (1.854 m)     Head Cir --      Peak Flow --      Pain Score 11/14/15 1604 10     Pain Loc --      Pain Edu? --      Excl. in GC? --     Constitutional: Alert and oriented. Well appearing and in no acute distress. Eyes: Conjunctivae are normal. PERRL. EOMI. Head: Atraumatic. Nose: No congestion/rhinnorhea. Mouth/Throat: Mucous membranes are moist.  Oropharynx non-erythematous. Neck: No stridor.   Cardiovascular: Normal rate, regular rhythm. Grossly normal heart sounds.  Good peripheral circulation. Respiratory: Normal respiratory effort.  No  retractions. Lungs CTAB. Gastrointestinal: Soft and nontender. No distention. No abdominal bruits. No CVA tenderness. Musculoskeletal: No visible deformity or abnormality to right knee with comparing with left. Mild edema noted to anterior aspect right knee. Patient is diffusely tender to palpation over patella and tibial tuberosity. Full range of motion is intact to knee. Varus, valgus, and Lachman's are all negative. McMurray's is negative. Neurologic:  Normal speech and language. No gross focal neurologic deficits are appreciated. No gait instability. Skin:  Skin is warm, dry and intact. No rash noted. Psychiatric: Mood and affect are normal. Speech and behavior are normal.  ____________________________________________   LABS (all labs ordered are listed, but only abnormal results are displayed)  Labs Reviewed - No data to display ____________________________________________  EKG   ____________________________________________  RADIOLOGY  Right knee x-ray Impression: No acute osseous abnormalities.  X-rays were personally reviewed by myself ____________________________________________   PROCEDURES  Procedure(s) performed: None  Critical Care performed: No  ____________________________________________   INITIAL IMPRESSION / ASSESSMENT AND PLAN / ED COURSE  Pertinent labs & imaging results that were available during my care of the patient were reviewed by me and considered in my medical decision making (see chart for details).  Patient's history, symptoms, physical exam are taken and consideration for diagnosis. Radiological imaging returns with no acute osseous abnormality. Advised patient of findings and diagnosis and he verbalizes understanding of same. The patient will be discharged home on meloxicam for symptom control. Patient is to follow-up with orthopedics should symptoms persist past treatment course. Patient verbalizes understanding of the diagnosis and treatment  plan and verbalizes compliance with same. ____________________________________________   FINAL CLINICAL IMPRESSION(S) / ED DIAGNOSES  Final diagnoses:  Knee contusion, right, initial encounter  Abrasion of leg, right, initial encounter      Racheal PatchesJonathan D Andilynn Delavega, PA-C 11/14/15 1721  Phineas SemenGraydon Goodman, MD 11/14/15 1943

## 2015-11-14 NOTE — ED Notes (Signed)
Pt has right knee pain.  Pt injured knee yesterday moving a dryer.  Right knee with mild swelling.  Abrasion to right lower leg.

## 2016-03-07 ENCOUNTER — Emergency Department: Payer: Medicare Other

## 2016-03-07 ENCOUNTER — Emergency Department
Admission: EM | Admit: 2016-03-07 | Discharge: 2016-03-07 | Disposition: A | Discharge status: Home / Self Care | Payer: Medicare Other | Attending: Emergency Medicine | Admitting: Emergency Medicine

## 2016-03-07 DIAGNOSIS — J069 Acute upper respiratory infection, unspecified: Secondary | ICD-10-CM | POA: Insufficient documentation

## 2016-03-07 DIAGNOSIS — Z791 Long term (current) use of non-steroidal anti-inflammatories (NSAID): Secondary | ICD-10-CM | POA: Insufficient documentation

## 2016-03-07 DIAGNOSIS — Z7952 Long term (current) use of systemic steroids: Secondary | ICD-10-CM | POA: Diagnosis not present

## 2016-03-07 DIAGNOSIS — F17210 Nicotine dependence, cigarettes, uncomplicated: Secondary | ICD-10-CM | POA: Diagnosis not present

## 2016-03-07 DIAGNOSIS — R05 Cough: Secondary | ICD-10-CM | POA: Diagnosis present

## 2016-03-07 LAB — RAPID INFLUENZA A&B ANTIGENS
Influenza A (ARMC): NEGATIVE
Influenza B (ARMC): NEGATIVE

## 2016-03-07 MED ORDER — ALBUTEROL SULFATE HFA 108 (90 BASE) MCG/ACT IN AERS: 2.0000 | INHALATION_SPRAY | Freq: Four times a day (QID) | RESPIRATORY_TRACT | Status: DC | PRN

## 2016-03-07 NOTE — Discharge Instructions (Signed)
Upper Respiratory Infection, Adult Most upper respiratory infections (URIs) are a viral infection of the air passages leading to the lungs. A URI affects the nose, throat, and upper air passages. The most common type of URI is nasopharyngitis and is typically referred to as "the common cold." URIs run their course and usually go away on their own. Most of the time, a URI does not require medical attention, but sometimes a bacterial infection in the upper airways can follow a viral infection. This is called a secondary infection. Sinus and middle ear infections are common types of secondary upper respiratory infections. Bacterial pneumonia can also complicate a URI. A URI can worsen asthma and chronic obstructive pulmonary disease (COPD). Sometimes, these complications can require emergency medical care and may be life threatening.  CAUSES Almost all URIs are caused by viruses. A virus is a type of germ and can spread from one person to another.  RISKS FACTORS You may be at risk for a URI if:   You smoke.   You have chronic heart or lung disease.  You have a weakened defense (immune) system.   You are very young or very old.   You have nasal allergies or asthma.  You work in crowded or poorly ventilated areas.  You work in health care facilities or schools. SIGNS AND SYMPTOMS  Symptoms typically develop 2-3 days after you come in contact with a cold virus. Most viral URIs last 7-10 days. However, viral URIs from the influenza virus (flu virus) can last 14-18 days and are typically more severe. Symptoms may include:   Runny or stuffy (congested) nose.   Sneezing.   Cough.   Sore throat.   Headache.   Fatigue.   Fever.   Loss of appetite.   Pain in your forehead, behind your eyes, and over your cheekbones (sinus pain).  Muscle aches.  DIAGNOSIS  Your health care provider may diagnose a URI by:  Physical exam.  Tests to check that your symptoms are not due to  another condition such as:  Strep throat.  Sinusitis.  Pneumonia.  Asthma. TREATMENT  A URI goes away on its own with time. It cannot be cured with medicines, but medicines may be prescribed or recommended to relieve symptoms. Medicines may help:  Reduce your fever.  Reduce your cough.  Relieve nasal congestion. HOME CARE INSTRUCTIONS   Take medicines only as directed by your health care provider.   Gargle warm saltwater or take cough drops to comfort your throat as directed by your health care provider.  Use a warm mist humidifier or inhale steam from a shower to increase air moisture. This may make it easier to breathe.  Drink enough fluid to keep your urine clear or pale yellow.   Eat soups and other clear broths and maintain good nutrition.   Rest as needed.   Return to work when your temperature has returned to normal or as your health care provider advises. You may need to stay home longer to avoid infecting others. You can also use a face mask and careful hand washing to prevent spread of the virus.  Increase the usage of your inhaler if you have asthma.   Do not use any tobacco products, including cigarettes, chewing tobacco, or electronic cigarettes. If you need help quitting, ask your health care provider. PREVENTION  The best way to protect yourself from getting a cold is to practice good hygiene.   Avoid oral or hand contact with people with cold   symptoms.   Wash your hands often if contact occurs.  There is no clear evidence that vitamin C, vitamin E, echinacea, or exercise reduces the chance of developing a cold. However, it is always recommended to get plenty of rest, exercise, and practice good nutrition.  SEEK MEDICAL CARE IF:   You are getting worse rather than better.   Your symptoms are not controlled by medicine.   You have chills.  You have worsening shortness of breath.  You have brown or red mucus.  You have yellow or brown nasal  discharge.  You have pain in your face, especially when you bend forward.  You have a fever.  You have swollen neck glands.  You have pain while swallowing.  You have white areas in the back of your throat. SEEK IMMEDIATE MEDICAL CARE IF:   You have severe or persistent:  Headache.  Ear pain.  Sinus pain.  Chest pain.  You have chronic lung disease and any of the following:  Wheezing.  Prolonged cough.  Coughing up blood.  A change in your usual mucus.  You have a stiff neck.  You have changes in your:  Vision.  Hearing.  Thinking.  Mood. MAKE SURE YOU:   Understand these instructions.  Will watch your condition.  Will get help right away if you are not doing well or get worse.   This information is not intended to replace advice given to you by your health care provider. Make sure you discuss any questions you have with your health care provider.   Document Released: 06/11/2001 Document Revised: 05/02/2015 Document Reviewed: 03/23/2014 Elsevier Interactive Patient Education 2016 Elsevier Inc.  

## 2016-03-07 NOTE — ED Provider Notes (Signed)
[facility] Emergency Department Provider Note  ____________________________________________  Time seen: Approximately 7:05 AM  I have reviewed the triage vital signs and the nursing notes.   HISTORY  Chief Complaint Chills    HPI Peter Kim is a 40 y.o. male who is presenting to the emergency department today with 1 week of cough as well as runny nose. He is unsure if he has had a fever. However, he has had worsening body aches over the past 2 days. He has had no known sick contacts. He has had several episodes of posttussive emesis. Eyes any nausea at this time. Says that the aches are primarily in his back and upper extremities. He says that the cough is worse in the morning and productive of green mucus which has had a very small amount of blood streaking.   Past Medical History  Diagnosis Date  . Hemorrhoid     There are no active problems to display for this patient.   No past surgical history on file.  Current Outpatient Rx  Name  Route  Sig  Dispense  Refill  . hydrocortisone (ANUSOL-HC) 25 MG suppository   Rectal   Place 1 suppository (25 mg total) rectally every 12 (twelve) hours.   12 suppository   1   . meloxicam (MOBIC) 15 MG tablet   Oral   Take 1 tablet (15 mg total) by mouth daily.   30 tablet   0   . methocarbamol (ROBAXIN) 500 MG tablet   Oral   Take 1 tablet (500 mg total) by mouth every 6 (six) hours as needed for muscle spasms.   30 tablet   0   . oxyCODONE-acetaminophen (PERCOCET) 7.5-325 MG tablet   Oral   Take 1 tablet by mouth every 4 (four) hours as needed for severe pain.   20 tablet   0   . oxyCODONE-acetaminophen (ROXICET) 5-325 MG per tablet   Oral   Take 1-2 tablets by mouth every 4 (four) hours as needed for severe pain.   15 tablet   0   . predniSONE (DELTASONE) 10 MG tablet      Take 5 pills daily for 5 days.   25 tablet   0     Allergies Review of patient's allergies indicates no known  allergies.  No family history on file.  Social History Social History  Substance Use Topics  . Smoking status: Current Every Day Smoker -- 0.50 packs/day    Types: Cigarettes  . Smokeless tobacco: Not on file  . Alcohol Use: Yes     Comment: occassionally     Review of Systems Constitutional: No fever/chills Eyes: No visual changes. ENT: No sore throat. Cardiovascular: Denies chest pain. Respiratory: Cough Gastrointestinal: No abdominal pain.  No nausea  No diarrhea.  No constipation. Genitourinary: Negative for dysuria. Musculoskeletal: As above Skin: Negative for rash. Neurological: Negative for headaches, focal weakness or numbness.  10-point ROS otherwise negative.  ____________________________________________   PHYSICAL EXAM:  VITAL SIGNS: ED Triage Vitals  Enc Vitals Group     BP 03/07/16 0622 148/82 mmHg     Pulse Rate 03/07/16 0622 87     Resp 03/07/16 0622 18     Temp 03/07/16 0622 98.2 F (36.8 C)     Temp Source 03/07/16 0622 Oral     SpO2 03/07/16 0622 97 %     Weight 03/07/16 0622 227 lb (102.967 kg)     Height 03/07/16 0622  (1.854 m)  Head Cir --      Peak Flow --      Pain Score 03/07/16 0623 8     Pain Loc --      Pain Edu? --      Excl. in GC? --     Constitutional: Alert and oriented. Well appearing and in no acute distress. Eyes: Conjunctivae are normal. PERRL. EOMI. Head: Atraumatic. Nose: Bilateral clear rhinorrhea with erythematous septum bilaterally with prominent vasculature Mouth/Throat: Mucous membranes are moist.  Oropharynx non-erythematous. Neck: No stridor.   Cardiovascular: Normal rate, regular rhythm. Grossly normal heart sounds.  Good peripheral circulation. Respiratory: Normal respiratory effort.  No retractions. Lungs CTAB. Gastrointestinal: Soft and nontender. No distention.No CVA tenderness. Musculoskeletal: No lower extremity tenderness nor edema.  No joint effusions. Neurologic:  Normal speech and language.  No gross focal neurologic deficits are appreciated. No gait instability. Skin:  Skin is warm, dry and intact. No rash noted. Psychiatric: Mood and affect are normal. Speech and behavior are normal.  ____________________________________________   LABS (all labs ordered are listed, but only abnormal results are displayed)  Labs Reviewed  RAPID INFLUENZA A&B ANTIGENS ([facility] ONLY)   ____________________________________________  EKG   ____________________________________________  RADIOLOGY  No active cardiopulmonary disease on the chest x-ray ____________________________________________   PROCEDURES    ____________________________________________   INITIAL IMPRESSION / ASSESSMENT AND PLAN / ED COURSE  Pertinent labs & imaging results that were available during my care of the patient were reviewed by me and considered in my medical decision making (see chart for details).  ----------------------------------------- 7:52 AM on 03/07/2016 -----------------------------------------  Reviewed the patient's labs as well as x-rays results. We discussed the results as well as the diagnosis of a viral illness. Patient knows use Tylenol and ibuprofen at home for symptomatically relief. I'll also be prescribing him an inhaler. He knows that reducing his amount of smoking, currently 1-1/2 packs every 2 days, will help with respiratory symptoms going forward. ____________________________________________   FINAL CLINICAL IMPRESSION(S) / ED DIAGNOSES  Upper respiratory infection.    Myrna Blazeravid Matthew Schaevitz, MD 03/07/16 [phone]

## 2016-03-07 NOTE — ED Notes (Signed)
Pt reports having congestion, cough, runny nose and body aches x2days. Pt states he has taken OTC meds with no relief.

## 2016-03-07 NOTE — ED Notes (Signed)
Pt in with cough, congestion, body aches, unsure of fever.

## 2016-04-04 ENCOUNTER — Emergency Department
Admission: EM | Admit: 2016-04-04 | Discharge: 2016-04-04 | Disposition: A | Discharge status: Home / Self Care | Payer: Medicare Other | Attending: Emergency Medicine | Admitting: Emergency Medicine

## 2016-04-04 ENCOUNTER — Encounter: Payer: Self-pay | Admitting: Urgent Care

## 2016-04-04 DIAGNOSIS — F17210 Nicotine dependence, cigarettes, uncomplicated: Secondary | ICD-10-CM | POA: Insufficient documentation

## 2016-04-04 DIAGNOSIS — M79641 Pain in right hand: Secondary | ICD-10-CM | POA: Diagnosis present

## 2016-04-04 DIAGNOSIS — G5601 Carpal tunnel syndrome, right upper limb: Secondary | ICD-10-CM | POA: Insufficient documentation

## 2016-04-04 HISTORY — DX: Gastro-esophageal reflux disease without esophagitis: K21.9

## 2016-04-04 MED ORDER — NAPROXEN 500 MG PO TABS: 500.0000 mg | ORAL_TABLET | Freq: Two times a day (BID) | ORAL | Status: DC

## 2016-04-04 MED ORDER — CYCLOBENZAPRINE HCL 5 MG PO TABS: 5.0000 mg | ORAL_TABLET | Freq: Three times a day (TID) | ORAL | Status: DC | PRN

## 2016-04-04 NOTE — ED Notes (Signed)
Patient presents with c/o hand cramping x 1 week; worse since Monday. Denies other symptoms.

## 2016-04-04 NOTE — ED Notes (Signed)
States he is having right hand "cramping" states this started couple of days ago  Denies any trauma but uses right hand repeatedly at work..has tried ibu w/o relief

## 2016-04-04 NOTE — ED Provider Notes (Signed)
[facility] Emergency Department Provider Note  ____________________________________________  Time seen: Approximately 7:24 AM  I have reviewed the triage vital signs and the nursing notes.   HISTORY  Chief Complaint Hand Pain    HPI Peter Kim is a 40 y.o. male , NAD, presents to the emergency department with 1 week history of right hand pain and cramping. Pain is worsened over the last couple of days. States he had similar episode approximately one year ago and was seen in an emergency department. States he was treated at that time with medications and the pain decreased. Notes that he uses his hands in a repetitive motion at work on a regular basis. Has not filed for [employer] but has notified his employer of the issue. Denies any overt injury or trauma. Has not had any skin sores or swelling. No falls or other injuries. Does note numbness and tingling to the right first, second, third fingers and along the back of the right hand. Pain worsened overnight last night. Has taken ibuprofen 800 mg twice a day 2 days which has not helped his pain.   Past Medical History  Diagnosis Date  . Hemorrhoid   . Acid reflux     There are no active problems to display for this patient.   History reviewed. No pertinent past surgical history.  Current Outpatient Rx  Name  Route  Sig  Dispense  Refill  . albuterol (PROVENTIL HFA;VENTOLIN HFA) 108 (90 Base) MCG/ACT inhaler   Inhalation   Inhale 2 puffs into the lungs every 6 (six) hours as needed for wheezing or shortness of breath.   1 Inhaler   0   . cyclobenzaprine (FLEXERIL) 5 MG tablet   Oral   Take 1 tablet (5 mg total) by mouth every 8 (eight) hours as needed for muscle spasms.   21 tablet   0   . naproxen (NAPROSYN) 500 MG tablet   Oral   Take 1 tablet (500 mg total) by mouth 2 (two) times daily with a meal.   14 tablet   0     Allergies Review of patient's allergies indicates no known  allergies.  No family history on file.  Social History Social History  Substance Use Topics  . Smoking status: Current Every Day Smoker -- 0.50 packs/day    Types: Cigarettes  . Smokeless tobacco: None  . Alcohol Use: Yes     Comment: occassionally      Review of Systems  Constitutional: No fever/chills Cardiovascular: No chest pain. Respiratory: No cough. No shortness of breath.  Gastrointestinal: No abdominal pain.   Musculoskeletal: Positive right hand and finger pain with muscle cramping. Negative for shoulder, neck pain.  Skin: Negative for rash. Neurological: Negative for headaches, focal weakness or numbness. Tingling noted about right fingers. 10-point ROS otherwise negative.  ____________________________________________   PHYSICAL EXAM:  VITAL SIGNS: ED Triage Vitals  Enc Vitals Group     BP 04/04/16 0657 140/94 mmHg     Pulse Rate 04/04/16 0657 85     Resp 04/04/16 0657 18     Temp 04/04/16 0657 98.2 F (36.8 C)     Temp Source 04/04/16 0657 Oral     SpO2 04/04/16 0657 95 %     Weight 04/04/16 0657 232 lb (105.235 kg)     Height 04/04/16 0657 6\' 1"  (1.854 m)     Head Cir --      Peak Flow --      Pain  Score 04/04/16 0657 10     Pain Loc --      Pain Edu? --      Excl. in GC? --     Constitutional: Alert and oriented. Well appearing and in no acute distress. Eyes: Conjunctivae are normal. Head: Atraumatic. Cardiovascular: Good peripheral circulation. Respiratory: Normal respiratory effort without tachypnea or retractions.  Musculoskeletal: Tenderness to palpation about the back of the right hand along the first, second, third metacarpals. No burning abnormalities to palpation. Full range of motion of the right hand and fingers with some difficulty making a complete fist with the right hand causing grip strength of the right hand be decreased. No edema.  No joint effusions. Neurologic:  Normal speech and language. No gross focal neurologic deficits are  appreciated. No decrease in sensation noted about the right hand or fingers. Skin:  Skin is warm, dry and intact. No rash noted. Psychiatric: Mood and affect are normal. Speech and behavior are normal. Patient exhibits appropriate insight and judgement.   ____________________________________________   LABS  None  ____________________________________________  EKG  None ____________________________________________  RADIOLOGY  None ____________________________________________    PROCEDURES  Procedure(s) performed: None    Medications - No data to display   ____________________________________________   INITIAL IMPRESSION / ASSESSMENT AND PLAN / ED COURSE  Patient's diagnosis is consistent with carpal tunnel syndrome of right wrist. Patient will be discharged home with prescriptions for naproxen and cyclobenzaprine to be taken as directed. Advise getting a thumb spica splint at a local medical supply store or pharmacy. Wear at night and at work.  Patient is to follow up with [facility] to establish care and to see Dr Hyacinth Meeker in Orthopedics. Patient is given ED precautions to return to the ED for any worsening or new symptoms.    ____________________________________________  FINAL CLINICAL IMPRESSION(S) / ED DIAGNOSES  Final diagnoses:  Carpal tunnel syndrome of right wrist      NEW MEDICATIONS STARTED DURING THIS VISIT:  Discharge Medication List as of 04/04/2016  7:44 AM    START taking these medications   Details  cyclobenzaprine (FLEXERIL) 5 MG tablet Take 1 tablet (5 mg total) by mouth every 8 (eight) hours as needed for muscle spasms., Starting 04/04/2016, Until Discontinued, Print    naproxen (NAPROSYN) 500 MG tablet Take 1 tablet (500 mg total) by mouth 2 (two) times daily with a meal., Starting 04/04/2016, Until Discontinued, Print             Hope Pigeon, PA-C 04/04/16 1610  Sharyn Creamer, MD 04/04/16 1544

## 2016-04-22 ENCOUNTER — Encounter: Payer: Self-pay | Admitting: Emergency Medicine

## 2016-04-22 ENCOUNTER — Emergency Department
Admission: EM | Admit: 2016-04-22 | Discharge: 2016-04-23 | Disposition: A | Discharge status: Home / Self Care | Payer: Medicare Other | Attending: Emergency Medicine | Admitting: Emergency Medicine

## 2016-04-22 DIAGNOSIS — K625 Hemorrhage of anus and rectum: Secondary | ICD-10-CM

## 2016-04-22 DIAGNOSIS — F17210 Nicotine dependence, cigarettes, uncomplicated: Secondary | ICD-10-CM | POA: Diagnosis not present

## 2016-04-22 DIAGNOSIS — K6289 Other specified diseases of anus and rectum: Secondary | ICD-10-CM

## 2016-04-22 DIAGNOSIS — K602 Anal fissure, unspecified: Secondary | ICD-10-CM | POA: Insufficient documentation

## 2016-04-22 NOTE — ED Notes (Signed)
Pt presents to ED with rectal pain and constipation. pt reports that he has noticed some blood when he wipes after having a bowel movement. Pt reports he has a hx of hemorrhoids approx 1 year ago with no complications. Preparation H used at home with no relief.

## 2016-04-23 MED ORDER — LIDOCAINE HCL 2 % EX GEL
CUTANEOUS | Status: AC
  Filled 2016-04-23: qty 10

## 2016-04-23 MED ORDER — DOCUSATE SODIUM 100 MG PO CAPS: 100.0000 mg | ORAL_CAPSULE | Freq: Every day | ORAL | Status: DC | PRN

## 2016-04-23 MED ORDER — LIDOCAINE HCL 2 % EX GEL
1.0000 "application " | Freq: Once | CUTANEOUS | Status: AC
  Administered 2016-04-23: 1
  Filled 2016-04-23: qty 5

## 2016-04-23 NOTE — Discharge Instructions (Signed)
Anal Fissure, Adult °An anal fissure is a small tear or crack in the skin around the anus. Bleeding from a fissure usually stops on its own within a few minutes. However, bleeding will often occur again with each bowel movement until the crack heals. °CAUSES °This condition may be caused by: °· Passing large, hard stool (feces). °· Frequent diarrhea. °· Constipation. °· Inflammatory bowel disease (Crohn disease or ulcerative colitis). °· Infections. °· Anal sex. °SYMPTOMS °Symptoms of this condition include: °· Bleeding from the rectum. °· Small amounts of blood seen on your stool, on toilet paper, or in the toilet after a bowel movement. °· Painful bowel movements. °· Itching or irritation around the anus. °DIAGNOSIS  °A health care provider may diagnose this condition by closely examining the anal area. An anal fissure can usually be seen with careful inspection. In some cases, a rectal exam may be performed, or a short tube (anoscope) may be used to examine the anal canal. °TREATMENT °Treatment for this condition may include: °· Taking steps to avoid constipation. This may include making changes to your diet, such as increasing your intake of fiber or fluid. °· Taking fiber supplements. These supplements can soften your stool to help make bowel movements easier. Your health care provider may also prescribe a stool softener if your stool is often hard. °· Taking sitz baths. This may help to heal the tear. °· Using medicated creams or ointments. These may be prescribed to lessen discomfort. °HOME CARE INSTRUCTIONS °Eating and Drinking °· Avoid foods that may be constipating, such as bananas and dairy products. °· Drink enough fluid to keep your urine clear or pale yellow. °· Maintain a diet that is high in fruits, whole grains, and vegetables. °General Instructions °· Keep the anal area as clean and dry as possible. °· Take sitz baths as told by your health care provider. Do not use soap in the sitz baths. °· Take  over-the-counter and prescription medicines only as told by your health care provider. °· Use creams or ointments only as told by your health care provider. °· Keep all follow-up visits as told by your health care provider. This is important. °SEEK MEDICAL CARE IF: °· You have more bleeding. °· You have a fever. °· You have diarrhea that is mixed with blood. °· You continue to have pain. °· Your problem is getting worse rather than better. °  °This information is not intended to replace advice given to you by your health care provider. Make sure you discuss any questions you have with your health care provider. °  °Document Released: 12/16/2005 Document Revised: 09/06/2015 Document Reviewed: 03/13/2015 °Elsevier Interactive Patient Education ©2016 [employer] ° °

## 2016-04-23 NOTE — ED Notes (Signed)
Pt reports that he has "a hemorrhoid flaring up real bad." Reports that it started 3 days ago. Has a hx of problems with hemorrhoids. Reports blood on the toilet paper when wiping. States, "the burning pain has me real agitated." In NAD.

## 2016-04-23 NOTE — ED Provider Notes (Signed)
[facility] Emergency Department Provider Note  ____________________________________________  Time seen: Approximately 0016 AM  I have reviewed the triage vital signs and the nursing notes.   HISTORY  Chief Complaint Rectal Bleeding and Rectal Pain    HPI Peter Kim is a 40 y.o. male who comes into the hospital today thinking that he has a hemorrhoid. He reports it is painful and he noticed some blood on the paper when he was wiping. He reports he has a burning sensation to his bottom. He did put some preparation H on it but it did not help. He reports that it helped the itching but has not helped the pain. He reports that this Saturday he had a very hard bowel movement. The pain started after the bowel movement. He reports that the pain does seem to fade mildly within returns after every bowel movement. He reports that his stools have been soft now but he still having pain and discomfort. He reports also if he sits or stands for too long he's having some pain. He has been using the Preparation H daily and reports that he can't sit on his bottom or leg on his back. He is having a hard time staying asleep. The patient is concerned so he decided to come in and get checked out. He reports that he felt back there and he felt a knot in his bottom.The patient was diagnosed with hemorrhoids last year. Patient has no abdominal pain, shortness of breath, chest pain, nausea or vomiting.   Past Medical History  Diagnosis Date  . Hemorrhoid   . Acid reflux     There are no active problems to display for this patient.   History reviewed. No pertinent past surgical history.  Current Outpatient Rx  Name  Route  Sig  Dispense  Refill  . albuterol (PROVENTIL HFA;VENTOLIN HFA) 108 (90 Base) MCG/ACT inhaler   Inhalation   Inhale 2 puffs into the lungs every 6 (six) hours as needed for wheezing or shortness of breath.   1 Inhaler   0   . cyclobenzaprine (FLEXERIL) 5 MG  tablet   Oral   Take 1 tablet (5 mg total) by mouth every 8 (eight) hours as needed for muscle spasms.   21 tablet   0   . docusate sodium (COLACE) 100 MG capsule   Oral   Take 1 capsule (100 mg total) by mouth daily as needed.   15 capsule   0   . naproxen (NAPROSYN) 500 MG tablet   Oral   Take 1 tablet (500 mg total) by mouth 2 (two) times daily with a meal.   14 tablet   0     Allergies Review of patient's allergies indicates no known allergies.  No family history on file.  Social History Social History  Substance Use Topics  . Smoking status: Current Every Day Smoker -- 0.50 packs/day    Types: Cigarettes  . Smokeless tobacco: None  . Alcohol Use: No     Comment: occassionally     Review of Systems Constitutional: No fever/chills Eyes: No visual changes. ENT: No sore throat. Cardiovascular: Denies chest pain. Respiratory: Denies shortness of breath. Gastrointestinal: No abdominal pain.  No nausea, no vomiting.  No diarrhea.  No constipation. Genitourinary: Negative for dysuria. Rectal: Rectal pain and bleeding Musculoskeletal: Negative for back pain. Skin: Negative for rash. Neurological: Negative for headaches, focal weakness or numbness.  10-point ROS otherwise negative.  ____________________________________________   PHYSICAL EXAM:  VITAL  SIGNS: ED Triage Vitals  Enc Vitals Group     BP 04/22/16 2341 135/78 mmHg     Pulse Rate 04/22/16 2341 94     Resp 04/22/16 2341 20     Temp 04/22/16 2341 98.3 F (36.8 C)     Temp Source 04/22/16 2341 Oral     SpO2 04/22/16 2341 95 %     Weight 04/22/16 2341 232 lb (105.235 kg)     Height 04/22/16 2341  (1.854 m)     Head Cir --      Peak Flow --      Pain Score 04/22/16 2341 10     Pain Loc --      Pain Edu? --      Excl. in GC? --     Constitutional: Alert and oriented. Well appearing and in Moderate distress. Eyes: Conjunctivae are normal. PERRL. EOMI. Head: Atraumatic. Nose: No  congestion/rhinnorhea. Mouth/Throat: Mucous membranes are moist.  Oropharynx non-erythematous. Cardiovascular: Normal rate, regular rhythm. Grossly normal heart sounds.  Good peripheral circulation. Respiratory: Normal respiratory effort.  No retractions. Lungs CTAB. Gastrointestinal: Soft and nontender. No distention. Positive bowel sounds Rectal: Tenderness palpation of the patient's rectum without a large visualized hemorrhoid. There is no visualized thrombosed hemorrhoid. The patient does have some significant pain with manipulation. Musculoskeletal: No lower extremity tenderness nor edema.   Neurologic:  Normal speech and language.  Skin:  Skin is warm, dry and intact.  Psychiatric: Mood and affect are normal.   ____________________________________________   LABS (all labs ordered are listed, but only abnormal results are displayed)  Labs Reviewed - No data to display ____________________________________________  EKG  none ____________________________________________  RADIOLOGY  none ____________________________________________   PROCEDURES  Procedure(s) performed: None  Critical Care performed: No  ____________________________________________   INITIAL IMPRESSION / ASSESSMENT AND PLAN / ED COURSE  Pertinent labs & imaging results that were available during my care of the patient were reviewed by me and considered in my medical decision making (see chart for details).  The patient is a 40 year old male who comes in today with some rectal burning and bleeding after having a bowel movement. The patient reports that he just notices some blood on the toilet paper. The patient does not have any large visualized hemorrhoids but I feel that he may have an anal fissure. The patient is very tender to palpation as well as to manipulation of his rectum. I will have the nurse place some lidocaine jelly to his rectum and discharge the patient to continue the Preparation H. The  patient requested a note for work.  ____________________________________________   FINAL CLINICAL IMPRESSION(S) / ED DIAGNOSES  Final diagnoses:  Rectal pain  Anal fissure  Rectal bleeding      Rebecka Apley, MD 04/23/16 [phone]

## 2016-04-23 NOTE — ED Notes (Signed)
Discharge instructions reviewed with patient. Patient verbalized understanding. Patient ambulated to lobby without difficulty.   

## 2016-05-28 ENCOUNTER — Emergency Department
Admission: EM | Admit: 2016-05-28 | Discharge: 2016-05-28 | Disposition: A | Discharge status: Home / Self Care | Payer: Medicare Other | Attending: Emergency Medicine | Admitting: Emergency Medicine

## 2016-05-28 ENCOUNTER — Emergency Department: Payer: Medicare Other

## 2016-05-28 ENCOUNTER — Encounter: Payer: Self-pay | Admitting: *Deleted

## 2016-05-28 DIAGNOSIS — Y92832 Beach as the place of occurrence of the external cause: Secondary | ICD-10-CM | POA: Diagnosis not present

## 2016-05-28 DIAGNOSIS — Y998 Other external cause status: Secondary | ICD-10-CM | POA: Diagnosis not present

## 2016-05-28 DIAGNOSIS — S8391XA Sprain of unspecified site of right knee, initial encounter: Secondary | ICD-10-CM | POA: Insufficient documentation

## 2016-05-28 DIAGNOSIS — S8991XA Unspecified injury of right lower leg, initial encounter: Secondary | ICD-10-CM | POA: Diagnosis present

## 2016-05-28 DIAGNOSIS — W500XXA Accidental hit or strike by another person, initial encounter: Secondary | ICD-10-CM | POA: Insufficient documentation

## 2016-05-28 DIAGNOSIS — F17210 Nicotine dependence, cigarettes, uncomplicated: Secondary | ICD-10-CM | POA: Diagnosis not present

## 2016-05-28 DIAGNOSIS — Y9372 Activity, wrestling: Secondary | ICD-10-CM | POA: Diagnosis not present

## 2016-05-28 MED ORDER — TRAMADOL HCL 50 MG PO TABS: 50.0000 mg | ORAL_TABLET | Freq: Four times a day (QID) | ORAL | Status: DC | PRN

## 2016-05-28 NOTE — ED Notes (Signed)
Pt to ED with right knee pain. Pt states [place] beach [place]. Pt states pain becoming worse. Pt ambulatory to room with no concerns. No swelling or deformity noted, vitals stable.

## 2016-05-28 NOTE — ED Provider Notes (Signed)
Time Seen: Approximately 2140 I have reviewed the triage notes  Chief Complaint: Knee Injury   History of Present Illness: Peter Kim is a 40 y.o. male who states he injured his right knee while wrestling on the beach. He states that someone landed directly on the outside surface of his right knee. He states he's noticed increasing pain and swelling on both the outer surface radiating up toward his right hip area and also on the inside surface of his knee. Been ambulatory and able to tolerate weightbearing on his right lower extremity.   Past Medical History  Diagnosis Date  . Hemorrhoid   . Acid reflux     There are no active problems to display for this patient.   History reviewed. No pertinent past surgical history.  History reviewed. No pertinent past surgical history.  Current Outpatient Rx  Name  Route  Sig  Dispense  Refill  . albuterol (PROVENTIL HFA;VENTOLIN HFA) 108 (90 Base) MCG/ACT inhaler   Inhalation   Inhale 2 puffs into the lungs every 6 (six) hours as needed for wheezing or shortness of breath.   1 Inhaler   0   . cyclobenzaprine (FLEXERIL) 5 MG tablet   Oral   Take 1 tablet (5 mg total) by mouth every 8 (eight) hours as needed for muscle spasms.   21 tablet   0   . docusate sodium (COLACE) 100 MG capsule   Oral   Take 1 capsule (100 mg total) by mouth daily as needed.   15 capsule   0   . naproxen (NAPROSYN) 500 MG tablet   Oral   Take 1 tablet (500 mg total) by mouth 2 (two) times daily with a meal.   14 tablet   0   . traMADol (ULTRAM) 50 MG tablet   Oral   Take 1 tablet (50 mg total) by mouth every 6 (six) hours as needed.   20 tablet   0     Allergies:  Review of patient's allergies indicates no known allergies.  Family History: History reviewed. No pertinent family history.  Social History: Social History  Substance Use Topics  . Smoking status: Current Every Day Smoker -- 0.50 packs/day    Types: Cigarettes  . Smokeless  tobacco: None  . Alcohol Use: No     Comment: occassionally      Review of Systems:   Patient denies any other injuries  Constitutional: No fever Cardiac: No chest pain Respiratory: No shortness of breath, wheezing, or stridor Abdomen: No abdominal pain, no vomiting, No diarrhea Extremities: Outside of her right lower extremity denies any other discomfort Skin: No rashes, easy bruising Neurologic: No focal weakness, trouble with speech or swollowing  Physical Exam:  ED Triage Vitals  Enc Vitals Group     BP 05/28/16 1946 126/92 mmHg     Pulse Rate 05/28/16 1944 81     Resp 05/28/16 1944 18     Temp 05/28/16 1944 98.6 F (37 C)     Temp Source 05/28/16 1944 Oral     SpO2 05/28/16 1944 100 %     Weight 05/28/16 1944 230 lb (104.327 kg)     Height 05/28/16 1944  (1.854 m)     Head Cir --      Peak Flow --      Pain Score 05/28/16 1945 10     Pain Loc --      Pain Edu? --      Excl. in  GC? --     General: Awake , Alert , and Oriented times 3; GCS 15 Head: Normal cephalic , atraumatic Lungs: Clear to ascultation without wheezes , rhonchi, or rales Heart: Regular rate, regular rhythm without murmurs , gallops , or rubs Abdomen: Soft, non tender without rebound, guarding , or rigidity; bowel sounds positive and symmetric in all 4 quadrants. No organomegaly .        Extremities:Examination of the right knee shows tenderness and some mild suprapatellar swelling proximally especially on the lateral surface of his right knee. Negative Lockman testing he has stability and her varus and valgus rotation. The extremity is neurovascularly intact. Patella is in place well aligned with no underlying crepitus Neurologic: normal ambulation, Motor symmetric without deficits, sensory intact Skin: warm, dry, no rashes    Radiology:     I personally reviewed the radiologic studies    DG Knee Complete 4 Views Right (Final result) Result time: 05/28/16 20:34:17   Final result by  Rad Results In Interface (05/28/16 20:34:17)   Narrative:   CLINICAL DATA: Right knee injury while playing on beach 2 days ago. Worsening knee pain and swelling. Initial encounter.  EXAM: RIGHT KNEE - COMPLETE 4+ VIEW  COMPARISON: 11/14/2015  FINDINGS: No evidence of fracture, dislocation, or joint effusion. Mild enthesopathic change noted along the superior pole of the patella at the quadriceps tendon insertion, which is stable. Soft tissues are unremarkable.  IMPRESSION: No acute findings.   Electronically Signed By: Myles RosenthalJohn Stahl M.D. On: 05/28/2016 20:      Procedures: Patient had application of a right knee immobilizer by the nursing staff     ED Course:  Patient's stay was unremarkable and does not appear to have suffered any other injuries outside of the strain on his right knee. I suspect he may have some underlying ligamentous strain but could not identify any severe injury at this time. X-rays of his knee appear to be within normal limits.    Assessment: * Right knee sprain   Final Clinical Impression: *  Final diagnoses:  Knee sprain, right, initial encounter     Plan:  Outpatient management Patient was advised take ibuprofen over-the-counter and was given a prescription for Ultram. He is been advised to rest ice and elevate his right knee and use a knee immobilizer when ambulatory and was referred to orthopedics unassigned Patient was advised to return immediately if condition worsens. Patient was advised to follow up with their primary care physician or other specialized physicians involved in their outpatient care. The patient and/or family member/power of attorney had laboratory results reviewed at the bedside. All questions and concerns were addressed and appropriate discharge instructions were distributed by the nursing staff.             Jennye MoccasinBrian S Quigley, MD 05/28/16 2229

## 2016-05-28 NOTE — Discharge Instructions (Signed)
Knee Sprain A knee sprain is a tear in one of the strong, fibrous tissues that connect the bones (ligaments) in your knee. The severity of the sprain depends on how much of the ligament is torn. The tear can be either partial or complete. CAUSES  Often, sprains are a result of a fall or injury. The force of the impact causes the fibers of your ligament to stretch too much. This excess tension causes the fibers of your ligament to tear. SIGNS AND SYMPTOMS  You may have some loss of motion in your knee. Other symptoms include:  Bruising.  Pain in the knee area.  Tenderness of the knee to the touch.  Swelling. DIAGNOSIS  To diagnose a knee sprain, your health care provider will physically examine your knee. Your health care provider may also suggest an X-ray exam of your knee to make sure no bones are broken. TREATMENT  If your ligament is only partially torn, treatment usually involves keeping the knee in a fixed position (immobilization) or bracing your knee for activities that require movement for several weeks. To do this, your health care provider will apply a bandage, cast, or splint to keep your knee from moving and to support your knee during movement until it heals. For a partially torn ligament, the healing process usually takes 4-6 weeks. If your ligament is completely torn, depending on which ligament it is, you may need surgery to reconnect the ligament to the bone or reconstruct it. After surgery, a cast or splint may be applied and will need to stay on your knee for 4-6 weeks while your ligament heals. HOME CARE INSTRUCTIONS  Keep your injured knee elevated to decrease swelling.  To ease pain and swelling, apply ice to the injured area:  Put ice in a plastic bag.  Place a towel between your skin and the bag.  Leave the ice on for 20 minutes, 2-3 times a day.  Only take medicine for pain as directed by your health care provider.  Do not leave your knee unprotected until  pain and stiffness go away (usually 4-6 weeks).  If you have a cast or splint, do not allow it to get wet. If you have been instructed not to remove it, cover it with a plastic bag when you shower or bathe. Do not swim.  Your health care provider may suggest exercises for you to do during your recovery to prevent or limit permanent weakness and stiffness. SEEK IMMEDIATE MEDICAL CARE IF:  Your cast or splint becomes damaged.  Your pain becomes worse.  You have significant pain, swelling, or numbness below the cast or splint. MAKE SURE YOU:  Understand these instructions.  Will watch your condition.  Will get help right away if you are not doing well or get worse.   This information is not intended to replace advice given to you by your health care provider. Make sure you discuss any questions you have with your health care provider.   Document Released: 12/16/2005 Document Revised: 01/06/2015 Document Reviewed: 07/28/2013 Elsevier Interactive Patient Education [employer]2016 Else[employer] Inc.  Please return immediately if condition worsens. Please contact her primary physician or the physician you were given for referral. If you have any specialist physicians involved in her treatment and plan please also contact them. Thank you for using [facility].

## 2016-05-28 NOTE — ED Notes (Signed)
MD at bedside. 

## 2016-06-17 ENCOUNTER — Emergency Department: Payer: Medicare Other

## 2016-06-17 ENCOUNTER — Encounter: Payer: Self-pay | Admitting: Emergency Medicine

## 2016-06-17 ENCOUNTER — Emergency Department
Admission: EM | Admit: 2016-06-17 | Discharge: 2016-06-17 | Disposition: A | Discharge status: Home / Self Care | Payer: Medicare Other | Attending: Emergency Medicine | Admitting: Emergency Medicine

## 2016-06-17 DIAGNOSIS — M7631 Iliotibial band syndrome, right leg: Secondary | ICD-10-CM | POA: Insufficient documentation

## 2016-06-17 DIAGNOSIS — M79604 Pain in right leg: Secondary | ICD-10-CM | POA: Diagnosis present

## 2016-06-17 DIAGNOSIS — F17210 Nicotine dependence, cigarettes, uncomplicated: Secondary | ICD-10-CM | POA: Diagnosis not present

## 2016-06-17 MED ORDER — OXYCODONE-ACETAMINOPHEN 5-325 MG PO TABS
1.0000 | ORAL_TABLET | Freq: Once | ORAL | Status: AC
  Administered 2016-06-17: 1 via ORAL
  Filled 2016-06-17: qty 1

## 2016-06-17 MED ORDER — DIAZEPAM 5 MG PO TABS: 5.0000 mg | ORAL_TABLET | Freq: Two times a day (BID) | ORAL | Status: DC | PRN

## 2016-06-17 MED ORDER — OXYCODONE-ACETAMINOPHEN 5-325 MG PO TABS: 1.0000 | ORAL_TABLET | Freq: Four times a day (QID) | ORAL | Status: DC | PRN

## 2016-06-17 MED ORDER — NAPROXEN 500 MG PO TABS: 500.0000 mg | ORAL_TABLET | Freq: Two times a day (BID) | ORAL | Status: DC

## 2016-06-17 NOTE — Discharge Instructions (Signed)
Heat Therapy Heat therapy can help ease sore, stiff, injured, and tight muscles and joints. Heat relaxes your muscles, which may help ease your pain.  RISKS AND COMPLICATIONS If you have any of the following conditions, do not use heat therapy unless your health care provider has approved:  Poor circulation.  Healing wounds or scarred skin in the area being treated.  Diabetes, heart disease, or high blood pressure.  Not being able to feel (numbness) the area being treated.  Unusual swelling of the area being treated.  Active infections.  Blood clots.  Cancer.  Inability to communicate pain. This may include young children and people who have problems with their brain function (dementia).  Pregnancy. Heat therapy should only be used on old, pre-existing, or long-lasting (chronic) injuries. Do not use heat therapy on new injuries unless directed by your health care provider. HOW TO USE HEAT THERAPY There are several different kinds of heat therapy, including:  Moist heat pack.  Warm water bath.  Hot water bottle.  Electric heating pad.  Heated gel pack.  Heated wrap.  Electric heating pad. Use the heat therapy method suggested by your health care provider. Follow your health care provider's instructions on when and how to use heat therapy. GENERAL HEAT THERAPY RECOMMENDATIONS  Do not sleep while using heat therapy. Only use heat therapy while you are awake.  Your skin may turn pink while using heat therapy. Do not use heat therapy if your skin turns red.  Do not use heat therapy if you have new pain.  High heat or long exposure to heat can cause burns. Be careful when using heat therapy to avoid burning your skin.  Do not use heat therapy on areas of your skin that are already irritated, such as with a rash or sunburn. SEEK MEDICAL CARE IF:  You have blisters, redness, swelling, or numbness.  You have new pain.  Your pain is worse. MAKE SURE  YOU:  Understand these instructions.  Will watch your condition.  Will get help right away if you are not doing well or get worse.   This information is not intended to replace advice given to you by your health care provider. Make sure you discuss any questions you have with your health care provider.   Document Released: 03/09/2012 Document Revised: 01/06/2015 Document Reviewed: 02/08/2014 Elsevier Interactive Patient Education 2016 Elsevier Inc.  Iliotibial Band Syndrome Iliotibial band syndrome is pain in the outer, lower thigh. The pain is caused by an inflammation of the iliotibial band. This is a band of thick fibrous tissue that runs down the outside of the thigh. The iliotibial band begins at the hip. It extends to the outer side of the shin bone (tibia) just below the knee joint. The band works with the thigh muscles. Together they provide stability to the outside of the knee joint. Iliotibial band syndrome occurs when there is inflammation to this band of tissue. This is typically due to over use and not due to an injury. The irritation usually occurs over the outside of the knee joint, at the the end of the thigh bone (femur). The iliotibial band crosses bone and muscle at this point. Between these structures is a cushioning sac (bursa). The bursa should make possible a smooth gliding motion. However, when inflamed, the iliotibial band does not glide easily. When inflamed, there is pain with motion of the knee. Usually the pain worsens with continued movement and the pain goes away with rest. This problem usually  arises when there is a sudden increase in sports activities involving your legs. Running, and playing soccer or basketball are examples of activities causing this. Others who are prone to iliotibial band syndrome include individuals with mechanical problems such as leg length differences, abnormality of walking, bowed legs etc. HOME CARE INSTRUCTIONS   Apply ice to the  injured area:  Put ice in a plastic bag.  Place a towel between your skin and the bag.  Leave the ice on for 20 minutes, 2-3 times a day.  Limit excessive training or eliminate training until pain goes away.  While pain is present, you may use gentle range of motion. Do not resume regular use until instructed by your health care provider. Begin use gradually. Do not increase activity to the point of pain. If pain does develop, decrease activity and continue the above measures. Gradually increase activities that do not cause discomfort. Do this until you finally achieve normal use.  Perform low-impact activities while pain is present. Wear proper footwear.  Only take over-the-counter or prescription medicines for pain, discomfort, or fever as directed by your health care provider. SEEK MEDICAL CARE IF:   Your pain increases or pain is not controlled with medications.  You develop new, unexplained symptoms, or an increase of the symptoms that brought you to your health care provider.  Your pain and symptoms are not improving or are getting worse.   This information is not intended to replace advice given to you by your health care provider. Make sure you discuss any questions you have with your health care provider.   Document Released: 06/07/2002 Document Revised: 01/06/2015 Document Reviewed: 07/15/2013 Elsevier Interactive Patient Education 2016 Elsevier Inc.  Musculoskeletal Pain Musculoskeletal pain is muscle and boney aches and pains. These pains can occur in any part of the body. Your caregiver may treat you without knowing the cause of the pain. They may treat you if blood or urine tests, X-rays, and other tests were normal.  CAUSES There is often not a definite cause or reason for these pains. These pains may be caused by a type of germ (virus). The discomfort may also come from overuse. Overuse includes working out too hard when your body is not fit. Boney aches also come from  weather changes. Bone is sensitive to atmospheric pressure changes. HOME CARE INSTRUCTIONS   Ask when your test results will be ready. Make sure you get your test results.  Only take over-the-counter or prescription medicines for pain, discomfort, or fever as directed by your caregiver. If you were given medications for your condition, do not drive, operate machinery or power tools, or sign legal documents for 24 hours. Do not drink alcohol. Do not take sleeping pills or other medications that may interfere with treatment.  Continue all activities unless the activities cause more pain. When the pain lessens, slowly resume normal activities. Gradually increase the intensity and duration of the activities or exercise.  During periods of severe pain, bed rest may be helpful. Lay or sit in any position that is comfortable.  Putting ice on the injured area.  Put ice in a bag.  Place a towel between your skin and the bag.  Leave the ice on for 15 to 20 minutes, 3 to 4 times a day.  Follow up with your caregiver for continued problems and no reason can be found for the pain. If the pain becomes worse or does not go away, it may be necessary to repeat tests or  do additional testing. Your caregiver may need to look further for a possible cause. SEEK IMMEDIATE MEDICAL CARE IF:  You have pain that is getting worse and is not relieved by medications.  You develop chest pain that is associated with shortness or breath, sweating, feeling sick to your stomach (nauseous), or throw up (vomit).  Your pain becomes localized to the abdomen.  You develop any new symptoms that seem different or that concern you. MAKE SURE YOU:   Understand these instructions.  Will watch your condition.  Will get help right away if you are not doing well or get worse.   This information is not intended to replace advice given to you by your health care provider. Make sure you discuss any questions you have with your  health care provider.   Document Released: 12/16/2005 Document Revised: 03/09/2012 Document Reviewed: 08/20/2013 Elsevier Interactive Patient Education [employer]

## 2016-06-17 NOTE — ED Notes (Signed)
Pt states he was here a couple of weeks ago for tx of his right leg pain.  He says he was x-rayed, given Tramadol, and sent home".  Today he presents with noticeable swelling above right patella compared to left one.  He has tried ice, ibuprofen, and "icy/hot" patch at home with no relief.  Patient has bilaterally equal pedal pulses.  Patient has pain and weakness while trying to extend or dorsiflex right foot.

## 2016-06-17 NOTE — ED Notes (Signed)
Patient transported to Ultrasound 

## 2016-06-17 NOTE — ED Provider Notes (Signed)
[facility] Emergency Department Provider Note  ____________________________________________  Time seen: 7:15 AM  I have reviewed the triage vital signs and the nursing notes.   HISTORY  Chief Complaint Leg Pain    HPI Peter Kim is a 40 y.o. male who complains of right leg pain from the thigh to the calf. He reports about 2 weeks ago he had a slip and fall injuring his right leg. Now, any pressure on the leg is painful worse when he sits. He has swelling in the right thigh as well that has been persistent. He reports that he was seen back in and had x-rays done that were unremarkable but he still has pain with use of the leg pressure or ambulation. Denies any instability or snapping or popping or tearing sensation. No chest pain shortness of breath or fever. Not relieved with NSAIDs Pain is constant and waxing and waning. Moderate intensity. Cramping sensation    Past Medical History  Diagnosis Date  . Hemorrhoid   . Acid reflux      There are no active problems to display for this patient.    No past surgical history on file. None  Current Outpatient Rx  Name  Route  Sig  Dispense  Refill  . diazepam (VALIUM) 5 MG tablet   Oral   Take 1 tablet (5 mg total) by mouth every 12 (twelve) hours as needed for muscle spasms.   4 tablet   0   . naproxen (NAPROSYN) 500 MG tablet   Oral   Take 1 tablet (500 mg total) by mouth 2 (two) times daily with a meal.   20 tablet   0   . oxyCODONE-acetaminophen (ROXICET) 5-325 MG tablet   Oral   Take 1 tablet by mouth every 6 (six) hours as needed for severe pain.   6 tablet   0    None  Allergies Review of patient's allergies indicates no known allergies.   No family history on file.  Social History Social History  Substance Use Topics  . Smoking status: Current Every Day Smoker -- 0.50 packs/day    Types: Cigarettes  . Smokeless tobacco: None  . Alcohol Use: No     Comment: occassionally      Review of Systems  Constitutional:   No fever or chills.   Cardiovascular:   No chest pain. Respiratory:   No dyspnea or cough. Gastrointestinal:   Negative for abdominal pain, vomiting and diarrhea.  Musculoskeletal:  Right leg pain and swelling as above  10-point ROS otherwise negative.  ____________________________________________   PHYSICAL EXAM:  VITAL SIGNS: ED Triage Vitals  Enc Vitals Group     BP 06/17/16 0547 159/109 mmHg     Pulse Rate 06/17/16 0547 97     Resp 06/17/16 0547 20     Temp 06/17/16 0547 99 F (37.2 C)     Temp Source 06/17/16 0547 Oral     SpO2 06/17/16 0547 96 %     Weight 06/17/16 0547 230 lb (104.327 kg)     Height 06/17/16 0547 6\' 1"  (1.854 m)     Head Cir --      Peak Flow --      Pain Score 06/17/16 0548 10     Pain Loc --      Pain Edu? --      Excl. in GC? --     Vital signs reviewed, nursing assessments reviewed.   Constitutional:   Alert and oriented. Well  appearing and in no distress. Eyes:   No scleral icterus. No conjunctival pallor. PERRL. EOMI.  No nystagmus. ENT   Head:   Normocephalic and atraumatic.   Nose:   No congestion/rhinnorhea. No septal hematoma   Mouth/Throat:   MMM, no pharyngeal erythema. No peritonsillar mass.    Neck:   No stridor. No SubQ emphysema. No meningismus. Hematological/Lymphatic/Immunilogical:   No cervical lymphadenopathy. Cardiovascular:   RRR. Symmetric bilateral radial and DP pulses.  No murmurs.  Respiratory:   Normal respiratory effort without tachypnea nor retractions. Breath sounds are clear and equal bilaterally. No wheezes/rales/rhonchi. Gastrointestinal:   Soft and nontender. Non distended. There is no CVA tenderness.  No rebound, rigidity, or guarding. Genitourinary:   deferred Musculoskeletal:   Right lower extremity is slightly swollen compared to the left. Diffuse tenderness in the soft tissues. Very tense IT band which is tender as well, particularly where it  courses over the lateral femoral epicondyle. Patient is holding the knee mostly and extension, but he does have intact active and passive range of motion. There is no appreciable knee effusion. No bony point tenderness. The knee ligaments are stable. Neurologic:   Normal speech and language.  CN 2-10 normal. Motor grossly intact. No gross focal neurologic deficits are appreciated.  Skin:    Skin is warm, dry and intact. No rash noted.  No petechiae, purpura, or bullae.  ____________________________________________    LABS (pertinent positives/negatives) (all labs ordered are listed, but only abnormal results are displayed) Labs Reviewed - No data to display ____________________________________________   EKG    ____________________________________________    RADIOLOGY  Ultrasound right lower extremity negative for DVT  ____________________________________________   PROCEDURES   ____________________________________________   INITIAL IMPRESSION / ASSESSMENT AND PLAN / ED COURSE  Pertinent labs & imaging results that were available during my care of the patient were reviewed by me and considered in my medical decision making (see chart for details).  Patient well appearing no acute distress. He is uncomfortable with right leg pain. His exam is not concerning for DVT with minimal swelling relative to the left, no erythema, and identifiable muscular tenseness indicative of a likely musculoskeletal injury, IT band strain. Ultrasound negative. No evidence of fracture dislocation or ligamentous injury. No evidence of soft tissue infection. I'll recommend heat therapy, range of motion exercises, short course of muscle relaxer, follow-up with primary care.     ____________________________________________   FINAL CLINICAL IMPRESSION(S) / ED DIAGNOSES  Final diagnoses:  IT band syndrome, right  Leg pain, diffuse, right       Portions of this note were generated with  dragon dictation software. Dictation errors may occur despite best attempts at proofreading.   Sharman Cheek, MD 06/17/16 [phone]

## 2016-06-17 NOTE — ED Notes (Addendum)
Pt states injured right leg approx 2 weeks pta. Pt states now has right leg pain from calf to "my booty bone". Pt ambulatory with limp, states "it's worse when i sit." pt denies shob. Pt with swelling noted to distal right thigh and right knee, ace intact to right knee.

## 2016-06-17 NOTE — ED Notes (Signed)
MD at bedside. 

## 2016-08-07 IMAGING — CR DG FOOT COMPLETE 3+V*L*
1 series · 3 of 3 positions shown · non-contrast
Comparison: 08/01/2014

CLINICAL DATA: broke 3 of his toes 3.5 months ago. spilled hot
water on on foot 3 months ago and dropped some bricks on left foot 2
days ago. pain entire left foot

EXAM:
LEFT FOOT - COMPLETE 3+ VIEW

[Series 1: ap · 0.17mm/px · 3 of 3 slices shown]
[im 1/3]
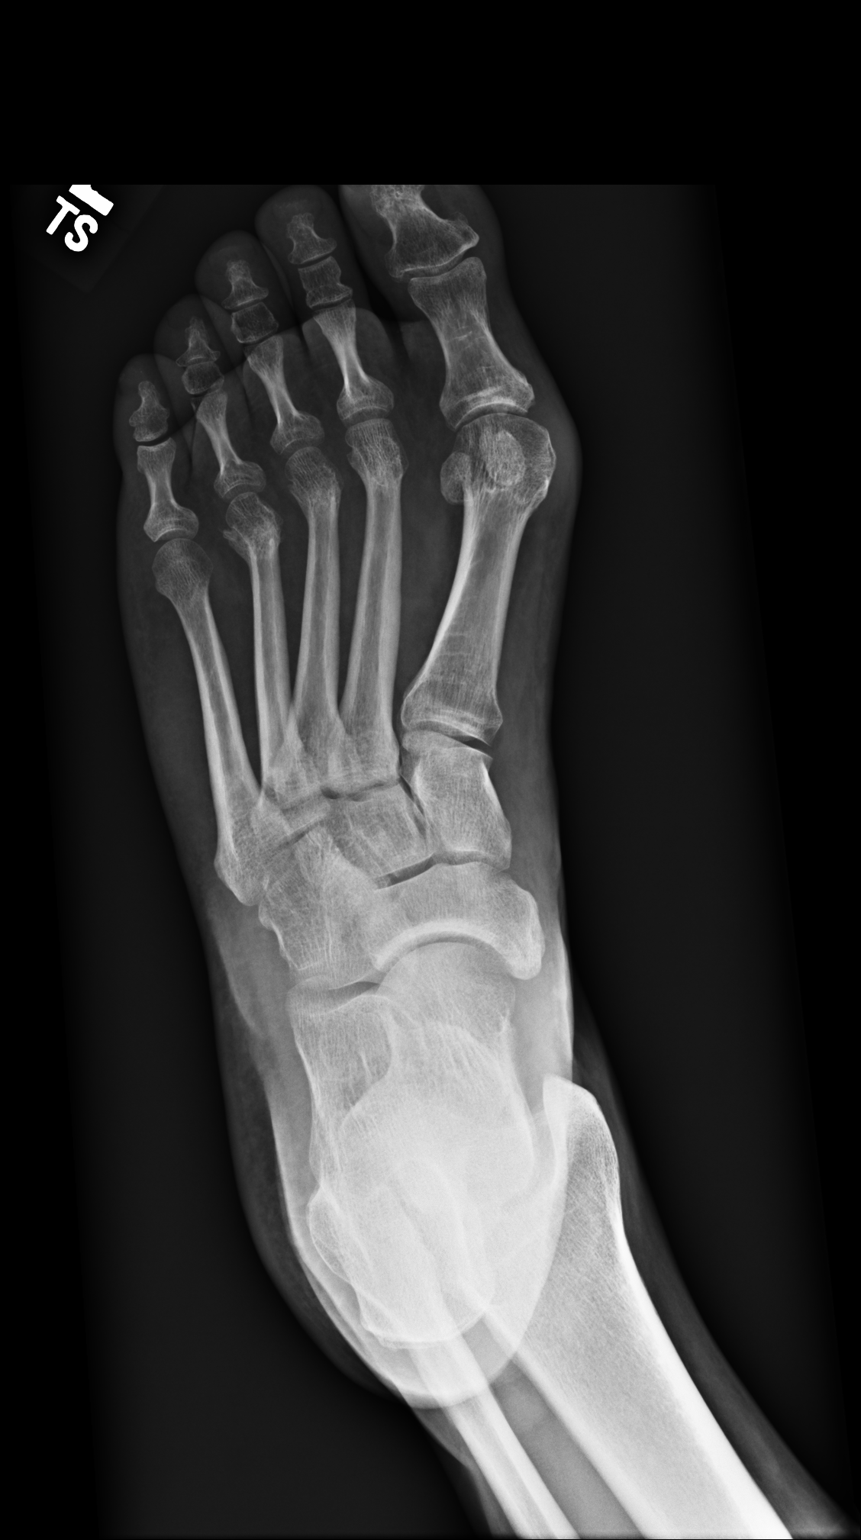
[im 2/3]
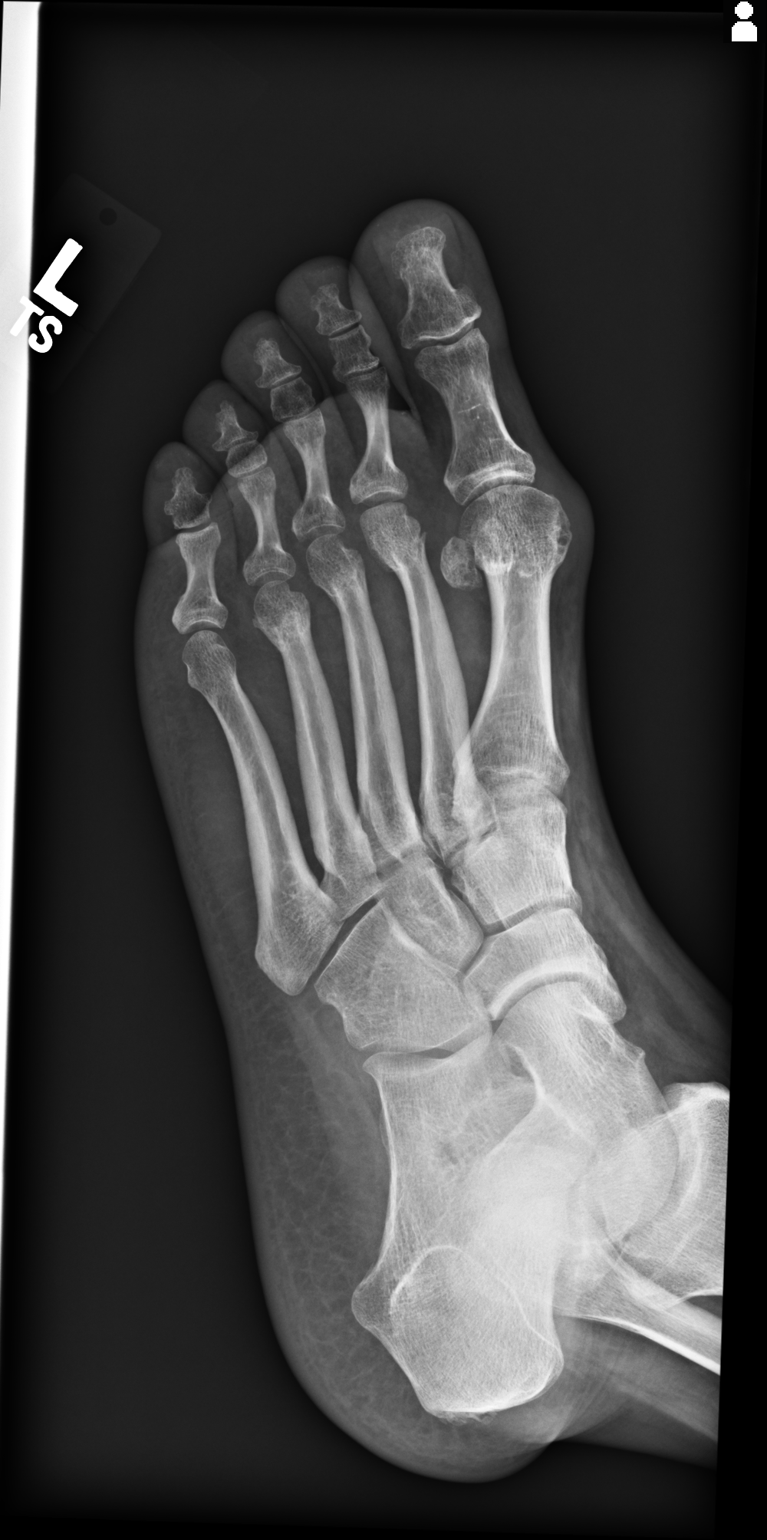
[im 3/3]
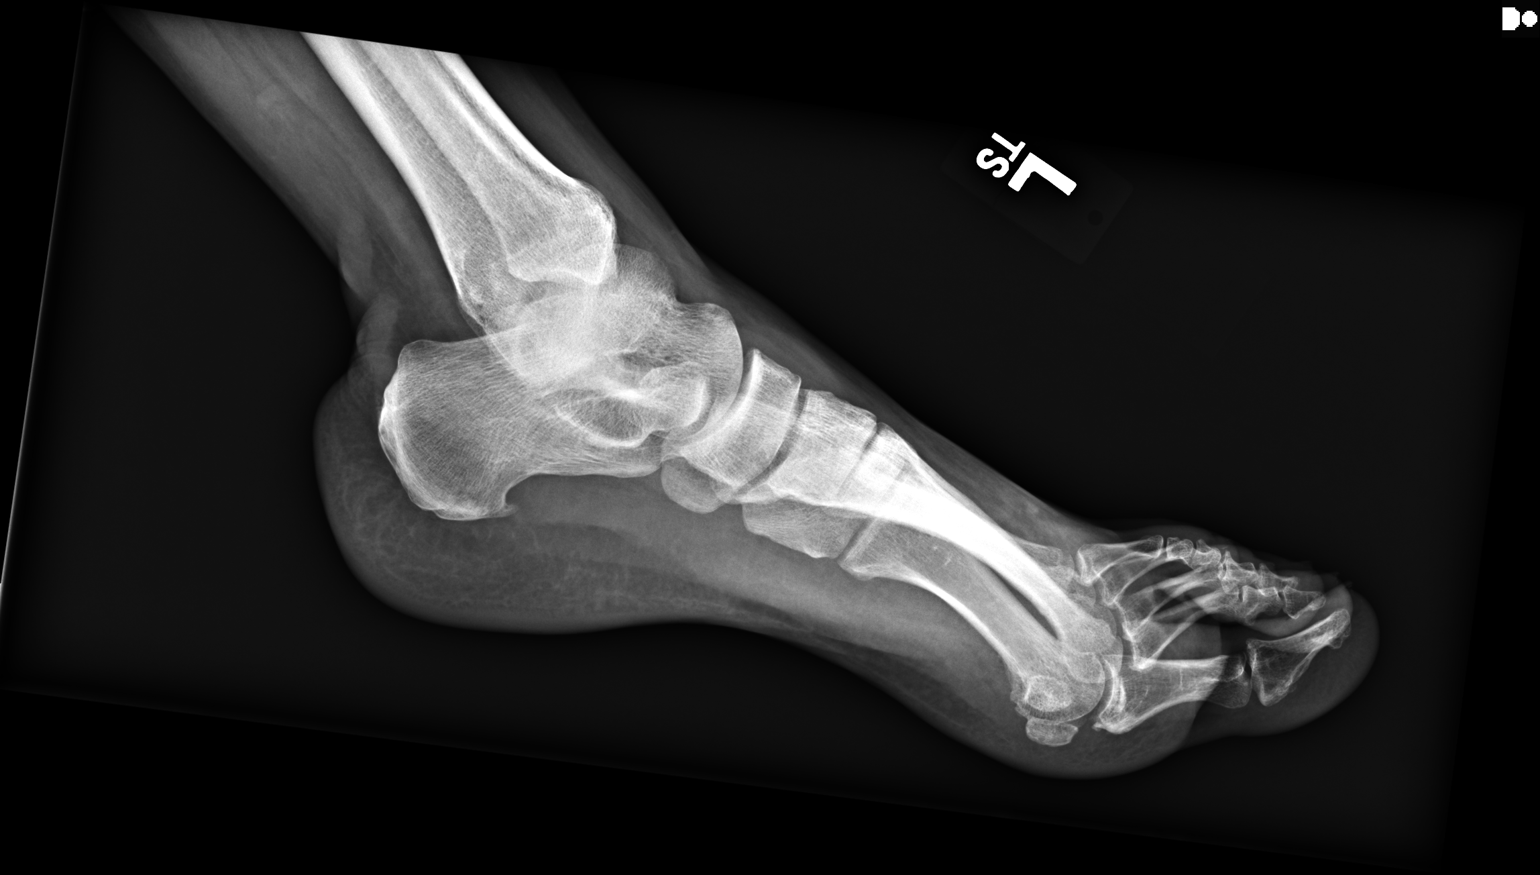

[3 of 3 positions shown; findings below may reference images not displayed]

FINDINGS: There is no evidence of acute fracture or dislocation. The
previously noted second through fourth metatarsal fractures show
evidence of interval healing, with stable lateral angulation of the
distal fracture fragments. Small calcaneal spur at the plantar
aponeurosis as before. There is no other evidence of arthropathy or
other focal bone abnormality. Soft tissues are unremarkable.
IMPRESSION: 1. Negative for acute fracture or dislocation.
2. Healing second through fourth metatarsal fractures with stable
alignment.

## 2016-08-20 ENCOUNTER — Emergency Department: Payer: Medicare Other

## 2016-08-20 ENCOUNTER — Emergency Department
Admission: EM | Admit: 2016-08-20 | Discharge: 2016-08-20 | Disposition: A | Discharge status: Home / Self Care | Payer: Medicare Other | Attending: Emergency Medicine | Admitting: Emergency Medicine

## 2016-08-20 ENCOUNTER — Encounter: Payer: Self-pay | Admitting: Medical Oncology

## 2016-08-20 DIAGNOSIS — S20212A Contusion of left front wall of thorax, initial encounter: Secondary | ICD-10-CM | POA: Insufficient documentation

## 2016-08-20 DIAGNOSIS — Y9389 Activity, other specified: Secondary | ICD-10-CM | POA: Insufficient documentation

## 2016-08-20 DIAGNOSIS — W010XXA Fall on same level from slipping, tripping and stumbling without subsequent striking against object, initial encounter: Secondary | ICD-10-CM | POA: Diagnosis not present

## 2016-08-20 DIAGNOSIS — S299XXA Unspecified injury of thorax, initial encounter: Secondary | ICD-10-CM | POA: Diagnosis present

## 2016-08-20 DIAGNOSIS — Y929 Unspecified place or not applicable: Secondary | ICD-10-CM | POA: Diagnosis not present

## 2016-08-20 DIAGNOSIS — Y999 Unspecified external cause status: Secondary | ICD-10-CM | POA: Diagnosis not present

## 2016-08-20 DIAGNOSIS — F17210 Nicotine dependence, cigarettes, uncomplicated: Secondary | ICD-10-CM | POA: Insufficient documentation

## 2016-08-20 MED ORDER — OXYCODONE-ACETAMINOPHEN 5-325 MG PO TABS: 1.0000 | ORAL_TABLET | ORAL | 0 refills | Status: DC | PRN

## 2016-08-20 MED ORDER — OXYCODONE-ACETAMINOPHEN 5-325 MG PO TABS
2.0000 | ORAL_TABLET | Freq: Once | ORAL | Status: AC
  Administered 2016-08-20: 2 via ORAL
  Filled 2016-08-20: qty 2

## 2016-08-20 MED ORDER — NAPROXEN 500 MG PO TABS: 500.0000 mg | ORAL_TABLET | Freq: Two times a day (BID) | ORAL | 0 refills | Status: DC

## 2016-08-20 NOTE — ED Notes (Signed)
See triage note  States he was moving a washing machine  Fell haivng pain to left lateral rib area   Large abrasion noted

## 2016-08-20 NOTE — ED Triage Notes (Signed)
Pt was moving a washing machine and tripped and fell. Has abrasion to left side ribs and pain to area. Pt in NAD, breathing appropriately.

## 2016-08-20 NOTE — ED Provider Notes (Signed)
[facility] Emergency Department Provider Note  ____________________________________________  Time seen: Approximately 12:33 PM  I have reviewed the triage vital signs and the nursing notes.   HISTORY  Chief Complaint Other (rib pain from fall)    HPI Donn PieriniLee Keown is a 40 y.o. male who presents for evaluation of left rib pain. Patient states he was moving washing machine when he tripped and fell. Has abrasion to his entire left rib area. Denies any difficulty breathing. By reports that does hurt to take a deep breath.   Past Medical History:  Diagnosis Date  . Acid reflux   . Hemorrhoid     There are no active problems to display for this patient.   History reviewed. No pertinent surgical history.  Prior to Admission medications   Medication Sig Start Date End Date Taking? Authorizing Provider  naproxen (NAPROSYN) 500 MG tablet Take 1 tablet (500 mg total) by mouth 2 (two) times daily with a meal. 08/20/16   Charmayne Sheerharles M Audon Heymann, PA-C  oxyCODONE-acetaminophen (ROXICET) 5-325 MG tablet Take 1-2 tablets by mouth every 4 (four) hours as needed for severe pain. 08/20/16   Evangeline Dakinharles M Neela Zecca, PA-C    Allergies Review of patient's allergies indicates no known allergies.  No family history on file.  Social History Social History  Substance Use Topics  . Smoking status: Current Every Day Smoker    Packs/day: 0.50    Types: Cigarettes  . Smokeless tobacco: Not on file  . Alcohol use No     Comment: occassionally     Review of Systems Constitutional: No fever/chills Cardiovascular: Denies chest pain. Respiratory: Denies shortness of breath. Musculoskeletal: Positive for left rib pains. Skin: Negative for rash. Neurological: Negative for headaches, focal weakness or numbness.  10-point ROS otherwise negative.  ____________________________________________   PHYSICAL EXAM:  VITAL SIGNS: ED Triage Vitals [08/20/16 1200]  Enc Vitals Group     BP  (!) 145/96     Pulse Rate 97     Resp 20     Temp 98.1 F (36.7 C)     Temp Source Oral     SpO2 97 %     Weight 228 lb (103.4 kg)     Height 6\' 1"  (1.854 m)     Head Circumference      Peak Flow      Pain Score 10     Pain Loc      Pain Edu?      Excl. in GC?     Constitutional: Alert and oriented. Well appearing and in no acute distress.  Cardiovascular: Normal rate, regular rhythm. Grossly normal heart sounds.  Good peripheral circulation. Respiratory: Normal respiratory effort.  No retractions. Lungs CTAB. Musculoskeletal: Positive for left rib pain. Obvious abrasions noted throughout the left ribs. Neurologic:  Normal speech and language. No gross focal neurologic deficits are appreciated. No gait instability. Skin:  Skin is warm, dry and intact. No rash noted. Psychiatric: Mood and affect are normal. Speech and behavior are normal.  ____________________________________________   LABS (all labs ordered are listed, but only abnormal results are displayed)  Labs Reviewed - No data to display ____________________________________________  EKG   ____________________________________________  RADIOLOGY  No acute osseous findings.  ____________________________________________   PROCEDURES  Procedure(s) performed: None  Critical Care performed: No  ____________________________________________   INITIAL IMPRESSION / ASSESSMENT AND PLAN / ED COURSE  Pertinent labs & imaging results that were available during my care of the patient were reviewed by me  and considered in my medical decision making (see chart for details). Review of the [place] CSRS was performed in accordance of the [facility] prior to dispensing any controlled drugs.  Acute left rib contusions. Reassurance provided to the patient. Rx given for Naprosyn and Percocet. Work excuse 1 day given. Patient to follow-up PCP or return to the ER as needed.  Clinical Course     ____________________________________________   FINAL CLINICAL IMPRESSION(S) / ED DIAGNOSES  Final diagnoses:  Rib contusion, left, initial encounter     This chart was dictated using voice recognition software/Dragon. Despite best efforts to proofread, errors can occur which can change the meaning. Any change was purely unintentional.    Evangeline Dakinharles M Nonie Lochner, PA-C 08/20/16 [address]1434    Rosario Kushner M Destynee Stringfellow, PA-C 08/20/16 1434    Sharman CheekPhillip Stafford, MD 08/21/16 [phone]

## 2017-03-13 IMAGING — CR DG HAND COMPLETE 3+V*R*
1 series · 3 of 3 positions shown · non-contrast
Comparison: None.

CLINICAL DATA: Right hand pain and swelling.

EXAM:
RIGHT HAND - COMPLETE 3+ VIEW

[Series 1: x hand pa right · 0.14mm/px · 3 of 3 slices shown]
[im 1/3]
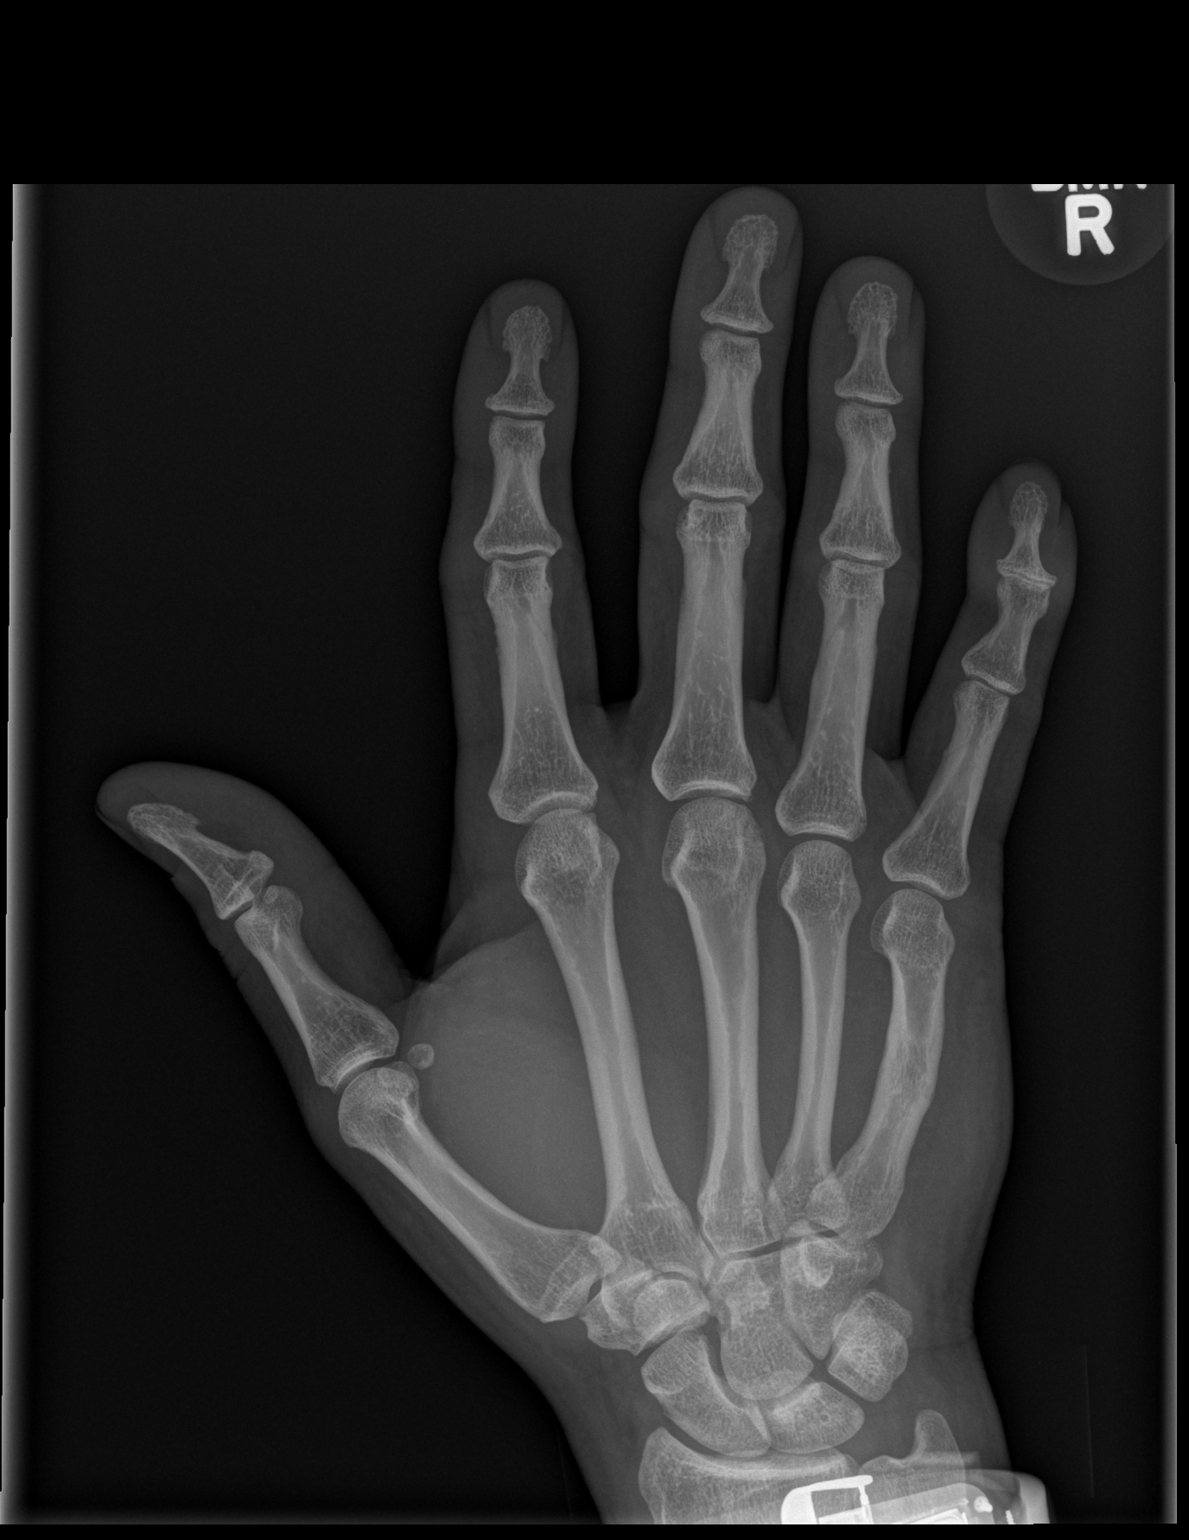
[im 2/3]
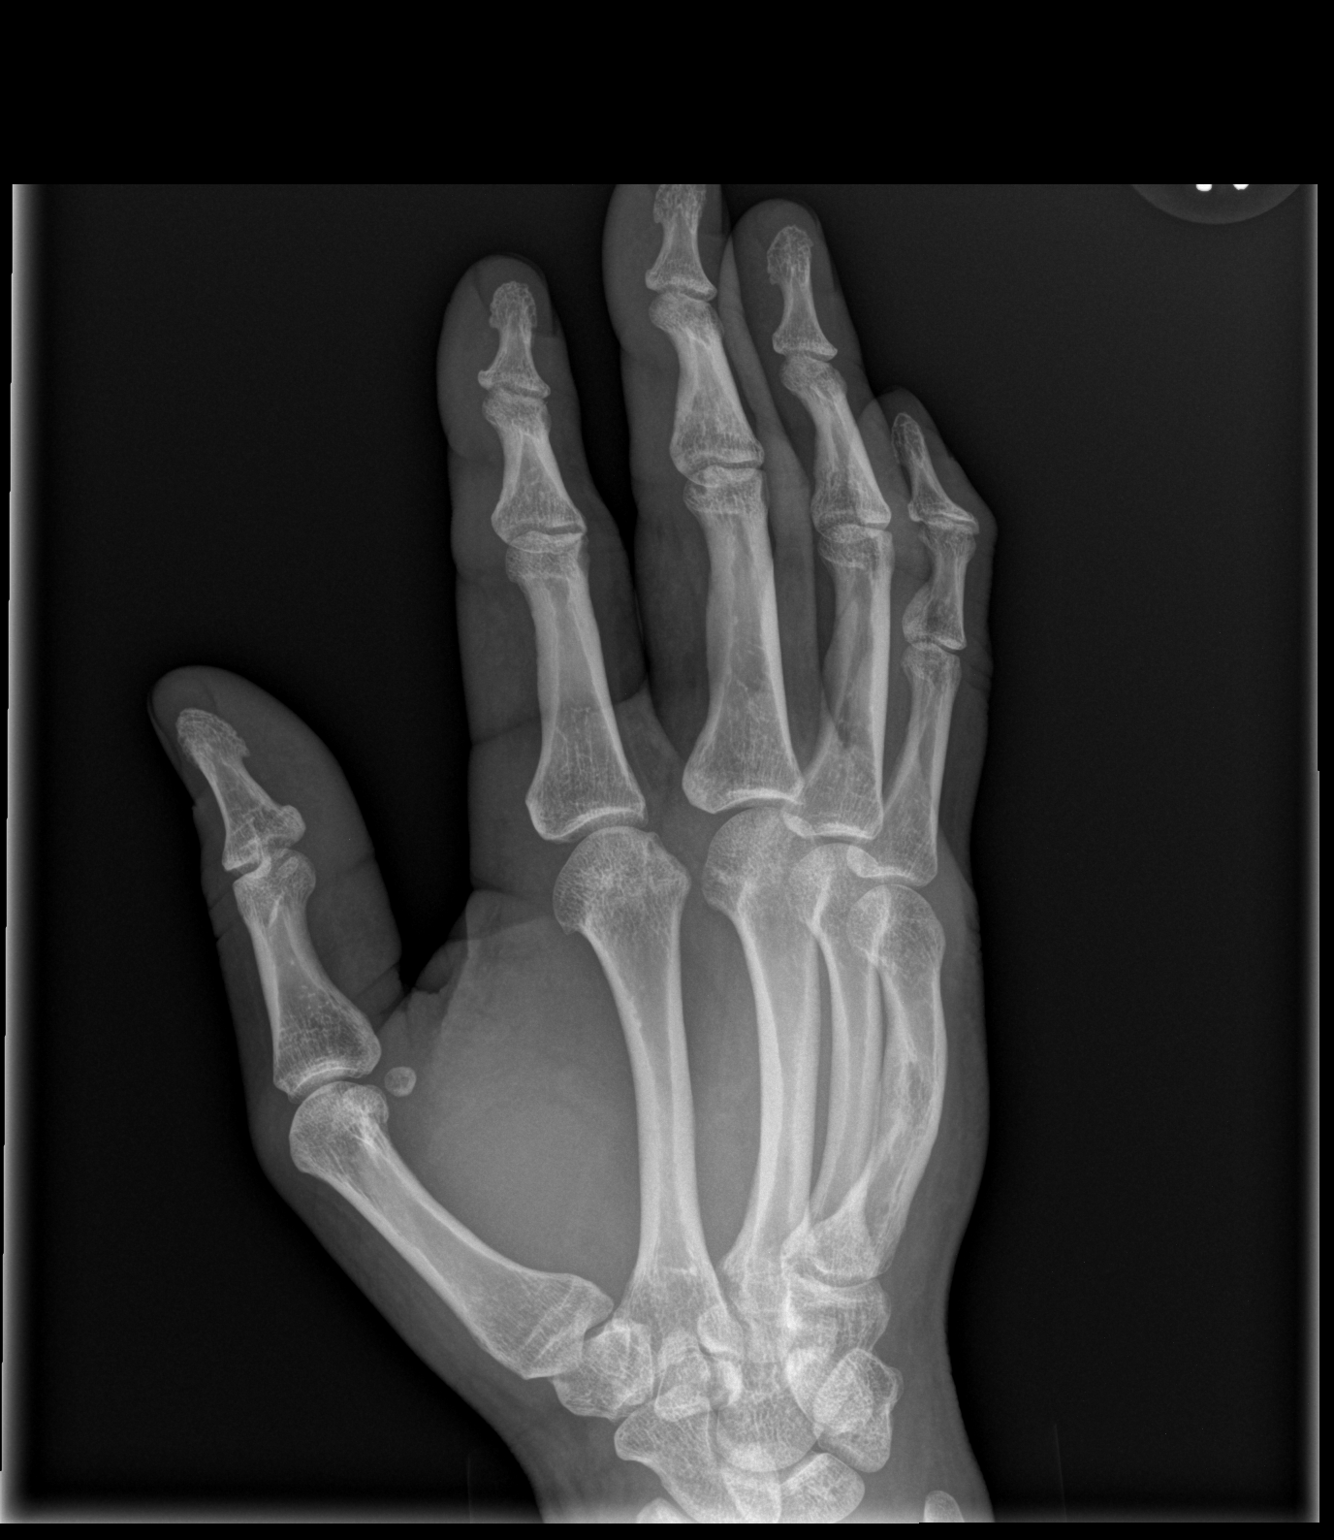
[im 3/3]
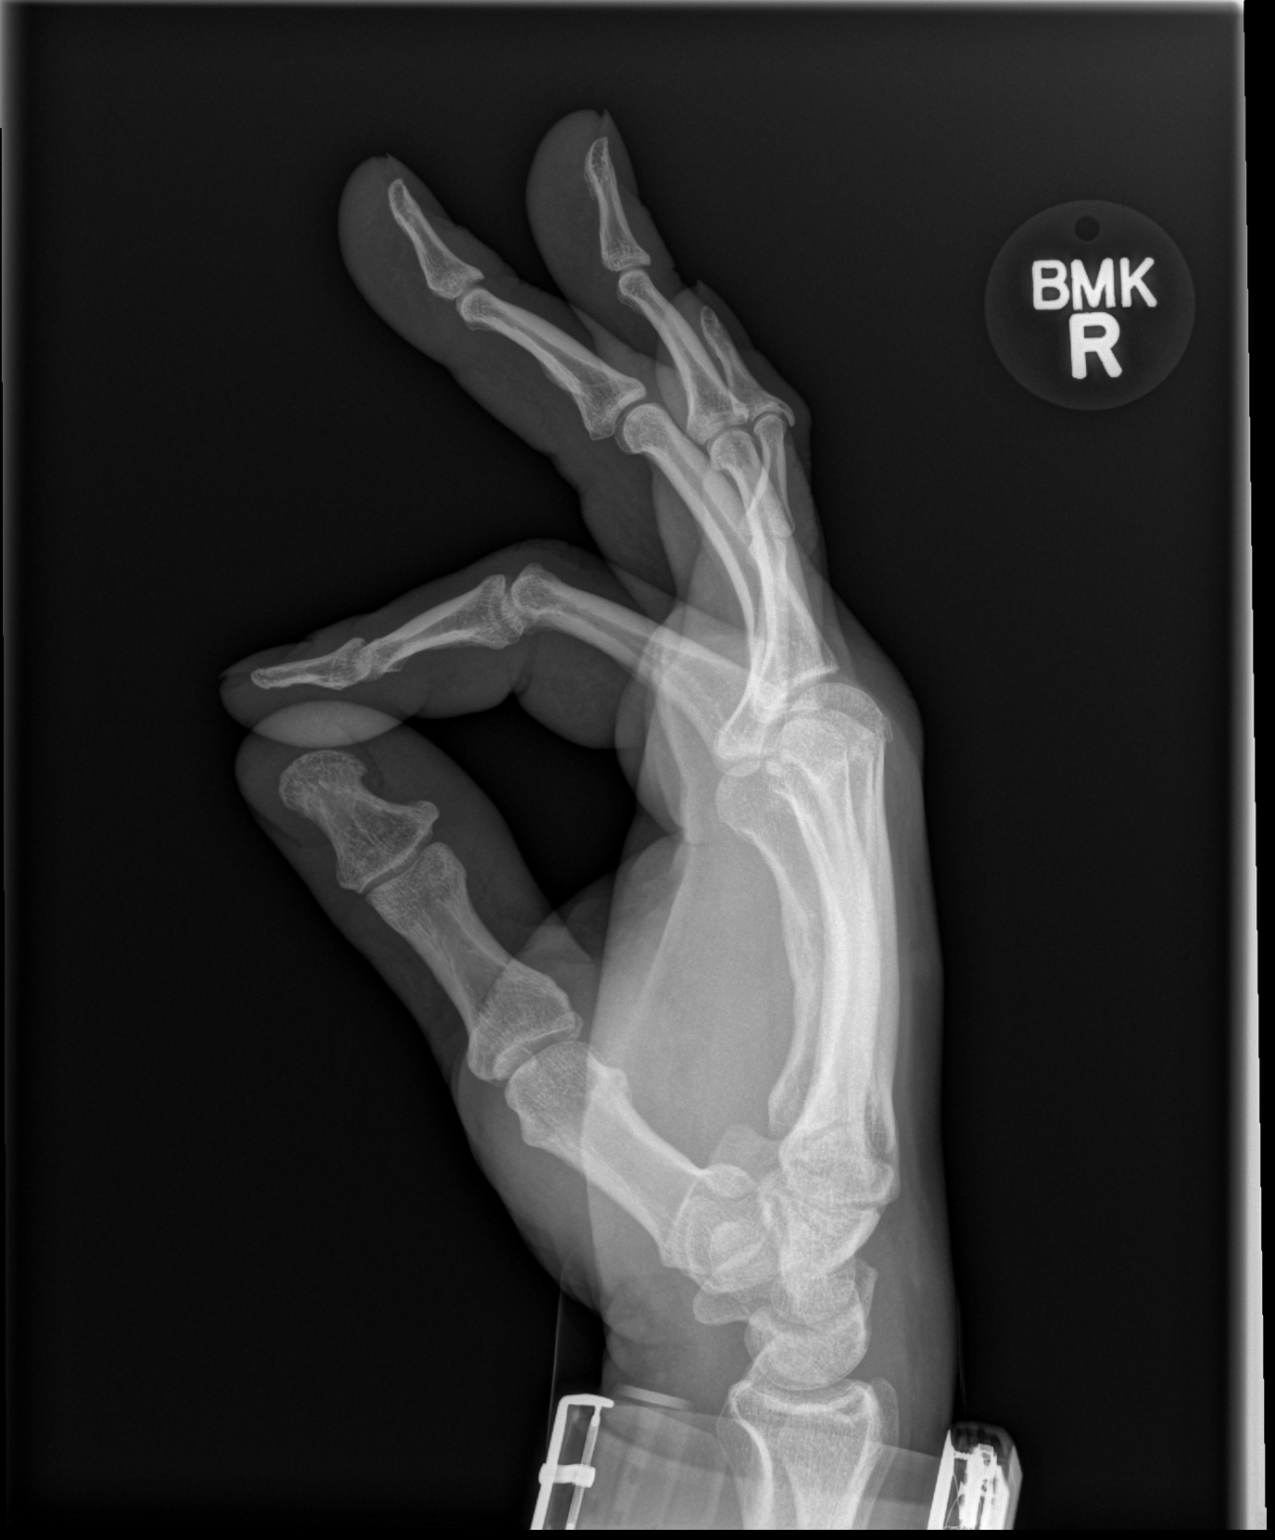

[3 of 3 positions shown; findings below may reference images not displayed]

FINDINGS: No fracture or dislocation or other acute abnormality. Old healed
fracture of the fifth metacarpal. Osteoarthritic changes of the IP
joints of the fingers at the second metacarpal phalangeal joint.
Minimal arthritis between the scaphoid and trapezium and trapezoid
and at the first carpal metacarpal joint.
IMPRESSION: No acute abnormality. Arthritic and old posttraumatic changes as
described.

## 2017-04-13 ENCOUNTER — Emergency Department: Payer: Medicare Other

## 2017-04-13 ENCOUNTER — Emergency Department
Admission: EM | Admit: 2017-04-13 | Discharge: 2017-04-13 | Disposition: A | Discharge status: Home / Self Care | Payer: Medicare Other | Attending: Student in an Organized Health Care Education/Training Program | Admitting: Student in an Organized Health Care Education/Training Program

## 2017-04-13 DIAGNOSIS — W1839XA Other fall on same level, initial encounter: Secondary | ICD-10-CM | POA: Insufficient documentation

## 2017-04-13 DIAGNOSIS — Y998 Other external cause status: Secondary | ICD-10-CM | POA: Diagnosis not present

## 2017-04-13 DIAGNOSIS — S60221A Contusion of right hand, initial encounter: Secondary | ICD-10-CM | POA: Insufficient documentation

## 2017-04-13 DIAGNOSIS — Y9367 Activity, basketball: Secondary | ICD-10-CM | POA: Diagnosis not present

## 2017-04-13 DIAGNOSIS — F17210 Nicotine dependence, cigarettes, uncomplicated: Secondary | ICD-10-CM | POA: Insufficient documentation

## 2017-04-13 DIAGNOSIS — Y929 Unspecified place or not applicable: Secondary | ICD-10-CM | POA: Insufficient documentation

## 2017-04-13 DIAGNOSIS — S6991XA Unspecified injury of right wrist, hand and finger(s), initial encounter: Secondary | ICD-10-CM | POA: Diagnosis present

## 2017-04-13 MED ORDER — NAPROXEN 500 MG PO TBEC
500.0000 mg | DELAYED_RELEASE_TABLET | Freq: Two times a day (BID) | ORAL | 0 refills | Status: AC
Start: 2017-04-13 — End: 2017-04-23

## 2017-04-13 NOTE — ED Notes (Signed)
FIRST NURSE NOTE: Right wrist pain after playing basketball 2 hours ago.

## 2017-04-13 NOTE — ED Provider Notes (Signed)
[facility] Emergency Department Provider Note  ____________________________________________  Time seen: Approximately 5:11 PM  I have reviewed the triage vital signs and the nursing notes.   HISTORY  Chief Complaint Wrist Pain   HPI Peter Kim is a 41 y.o. male presenting to the emergency department with acute and aching10 out of 10 right wrist pain after falling backwards while playing basketball earlier today. Patient denies hitting his head during the incident. Patient denies prior traumas or surgeries affecting the right upper extremity. Patient is right handed. Patient denies radiculopathy or weakness. Patient states "can I get a work note?". No alleviating measures have been attempted.   Past Medical History:  Diagnosis Date  . Acid reflux   . Hemorrhoid     There are no active problems to display for this patient.   No past surgical history on file.  Prior to Admission medications   Medication Sig Start Date End Date Taking? Authorizing Provider  naproxen (EC NAPROSYN) 500 MG EC tablet Take 1 tablet (500 mg total) by mouth 2 (two) times daily with a meal. 04/13/17 04/23/17  Orvil Feil, PA-C  oxyCODONE-acetaminophen (ROXICET) 5-325 MG tablet Take 1-2 tablets by mouth every 4 (four) hours as needed for severe pain. 08/20/16   Evangeline Dakin, PA-C    Allergies Patient has no known allergies.  No family history on file.  Social History Social History  Substance Use Topics  . Smoking status: Current Every Day Smoker    Packs/day: 0.50    Types: Cigarettes  . Smokeless tobacco: Not on file  . Alcohol use No     Comment: occassionally     Review of Systems  Constitutional: No fever/chills Eyes: No visual changes. No discharge ENT: No upper respiratory complaints. Cardiovascular: no chest pain. Respiratory: no cough. No SOB. Gastrointestinal: No abdominal pain.  No nausea, no vomiting.  No diarrhea.  No  constipation. Musculoskeletal: Patient has right wrist pain.  Skin: Negative for rash, abrasions, lacerations, ecchymosis. Neurological: Negative for headaches, focal weakness or numbness.  ____________________________________________   PHYSICAL EXAM:  VITAL SIGNS: ED Triage Vitals [04/13/17 1631]  Enc Vitals Group     BP (!) 158/93     Pulse Rate 88     Resp 18     Temp 98.8 F (37.1 C)     Temp Source Oral     SpO2 96 %     Weight 230 lb (104.3 kg)     Height  (1.854 m)     Head Circumference      Peak Flow      Pain Score 10     Pain Loc      Pain Edu?      Excl. in GC?     Constitutional: Alert and oriented. Well appearing and in no acute distress. Eyes: Conjunctivae are normal. PERRL. EOMI. Head: Atraumatic.  Cardiovascular: Normal rate, regular rhythm. Normal S1 and S2.  Good peripheral circulation. Respiratory: Normal respiratory effort without tachypnea or retractions. Lungs CTAB. Good air entry to the bases with no decreased or absent breath sounds. Musculoskeletal: Patient has 5 out of 5 strength in the upper extremities bilaterally. Patient is able to perform full range of motion at the right shoulder, right elbow and right wrist. Patient is able to move all 5 right fingers. No pain is elicited with palpation over the right hand. Palpable radial and ulnar pulses bilaterally and symmetrically. Neurologic:  Normal speech and language. No gross focal neurologic  deficits are appreciated.  Skin:  Skin is warm, dry and intact. No rash noted. Psychiatric: Mood and affect are normal. Speech and behavior are normal. Patient exhibits appropriate insight and judgement.  ____________________________________________   LABS (all labs ordered are listed, but only abnormal results are displayed)  Labs Reviewed - No data to display ____________________________________________  EKG   ____________________________________________  RADIOLOGY Geraldo Pitter,  personally viewed and evaluated these images (plain radiographs) as part of my medical decision making, as well as reviewing the written report by the radiologist.    Dg Wrist Complete Right  Result Date: 04/13/2017 CLINICAL DATA:  Fall wall playing basketball with distal radial pain, initial encounter EXAM: RIGHT WRIST - COMPLETE 3+ VIEW COMPARISON:  08/09/15 FINDINGS: Changes of prior healed fracture in the fifth metacarpal are again seen. No acute fracture or dislocation is noted. No soft tissue changes are seen. IMPRESSION: No acute abnormality noted. Electronically Signed   By: Alcide Clever M.D.   On: 04/13/2017 17:24    ____________________________________________    PROCEDURES  Procedure(s) performed:    Procedures    Medications - No data to display   ____________________________________________   INITIAL IMPRESSION / ASSESSMENT AND PLAN / ED COURSE  Pertinent labs & imaging results that were available during my care of the patient were reviewed by me and considered in my medical decision making (see chart for details).  Review of the [place] CSRS was performed in accordance of the [facility] prior to dispensing any controlled drugs.     Assessment and plan: Right Hand Contusion:  Patient presents to the emergency department with right hand pain after falling while playing basketball. Neurologic exam and overall physical exam are reassuring at this time. DG right wrist reveals no acute bony abnormalities. Patient was discharged with naproxen. A removable volar splint was applied in the emergency department. Patient was neurovascularly intact after splint application. A referral was given to orthopedics, Dr. Hyacinth Meeker. All patient questions were answered.     ____________________________________________  FINAL CLINICAL IMPRESSION(S) / ED DIAGNOSES  Final diagnoses:  Contusion of right hand, initial encounter      NEW MEDICATIONS STARTED DURING THIS VISIT:  Discharge  Medication List as of 04/13/2017  6:12 PM    START taking these medications   Details  naproxen (EC NAPROSYN) 500 MG EC tablet Take 1 tablet (500 mg total) by mouth 2 (two) times daily with a meal., Starting Sun 04/13/2017, Until Wed 04/23/2017, Print            This chart was dictated using voice recognition software/Dragon. Despite best efforts to proofread, errors can occur which can change the meaning. Any change was purely unintentional.    Orvil Feil, PA-C 04/13/17 1911    Willy Eddy, MD 04/23/17 [phone]

## 2017-04-13 NOTE — ED Notes (Signed)
See triage note  Larey Seat back onto right wrist    Positive pulses  Min swelling

## 2017-04-13 NOTE — ED Triage Notes (Signed)
Pt states that while playing basketball around 1400 he fell backwards attempting to catch himself with his rt wrist, pt has swelling and pain to the wrist area, pt is able to move fingers, palpable radial pulse noted in triage

## 2017-10-10 IMAGING — CR DG CHEST 2V
2 series · 2 of 2 positions shown · non-contrast
Comparison: None.

CLINICAL DATA: Cough, chest pain.

EXAM:
CHEST  2 VIEW

[chest pa]
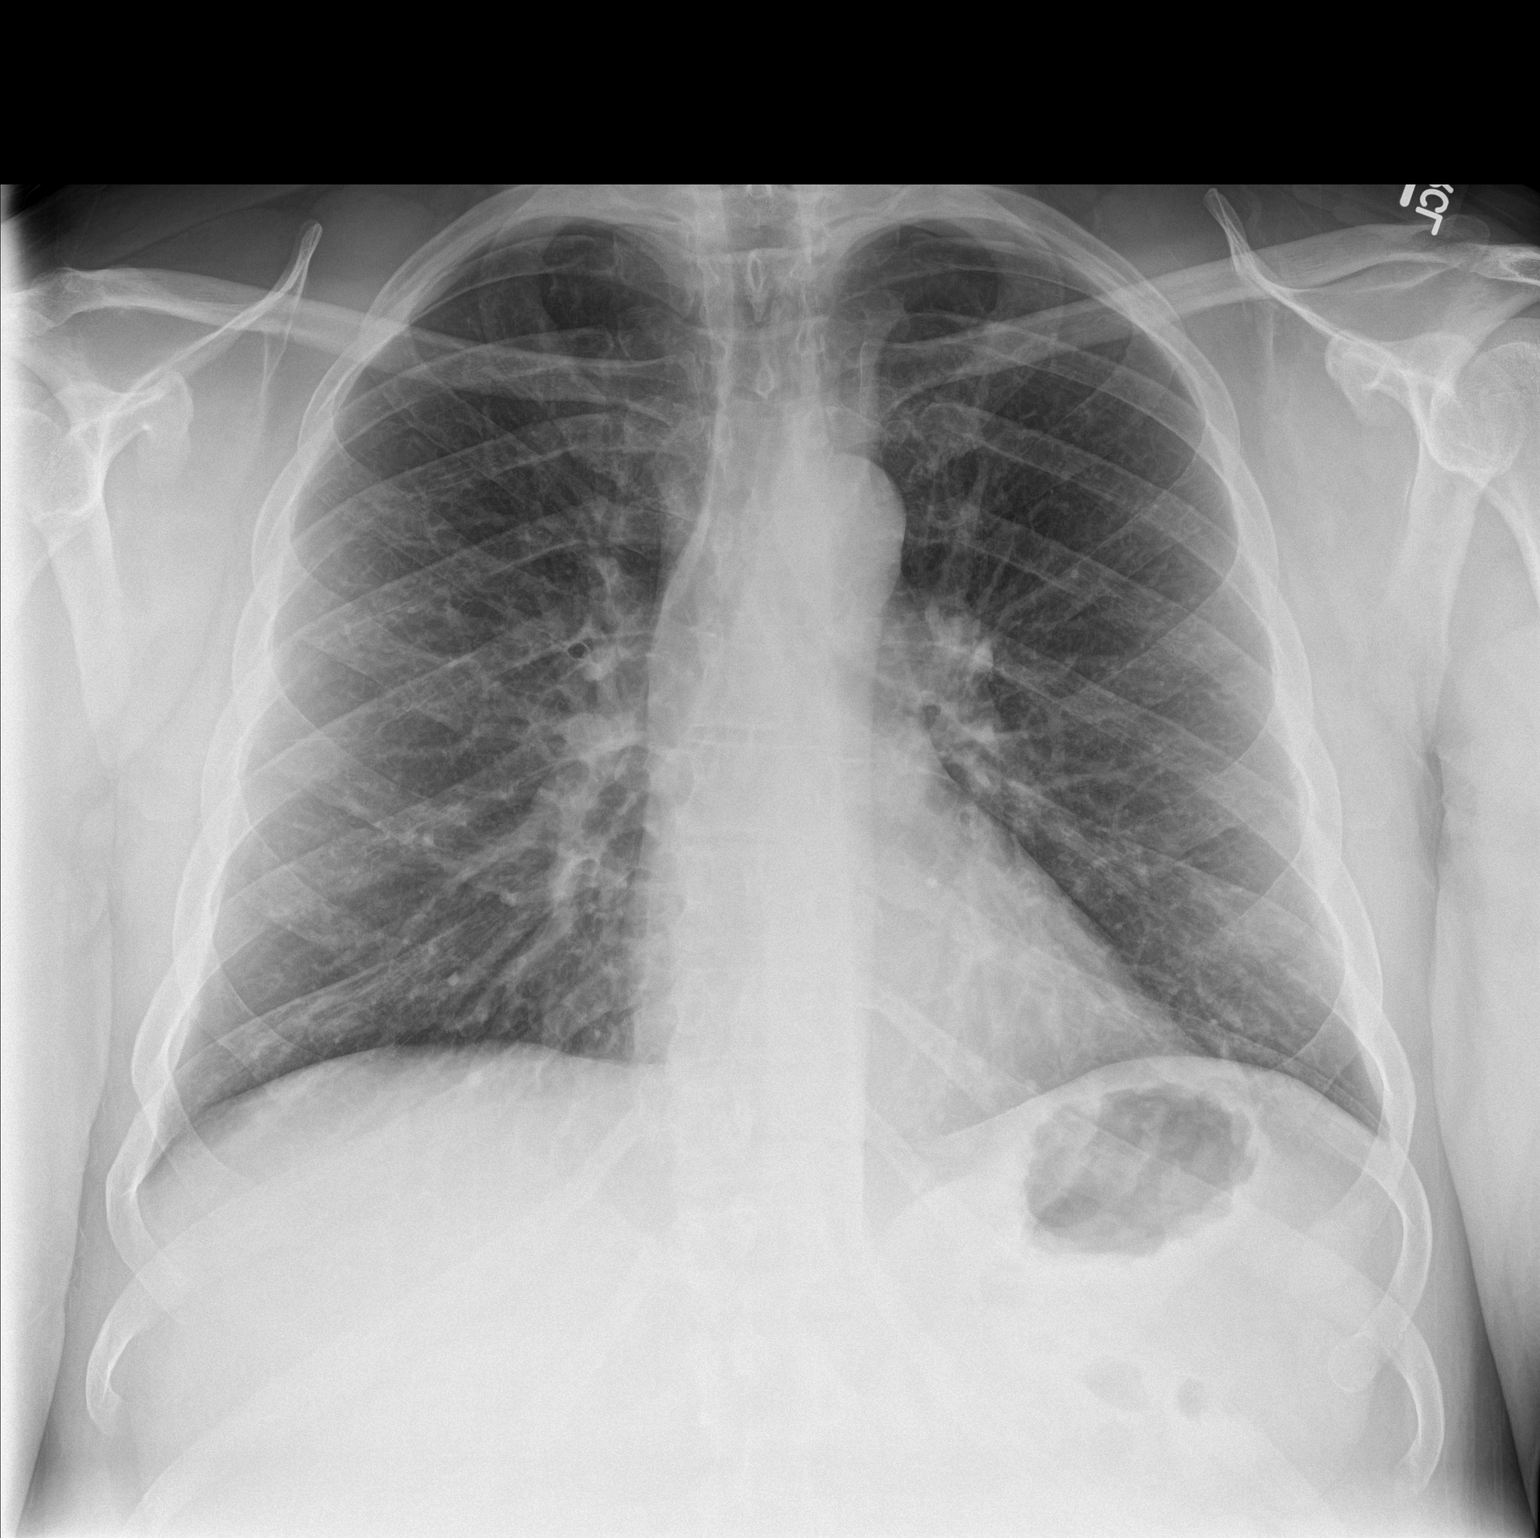

[chest lat]
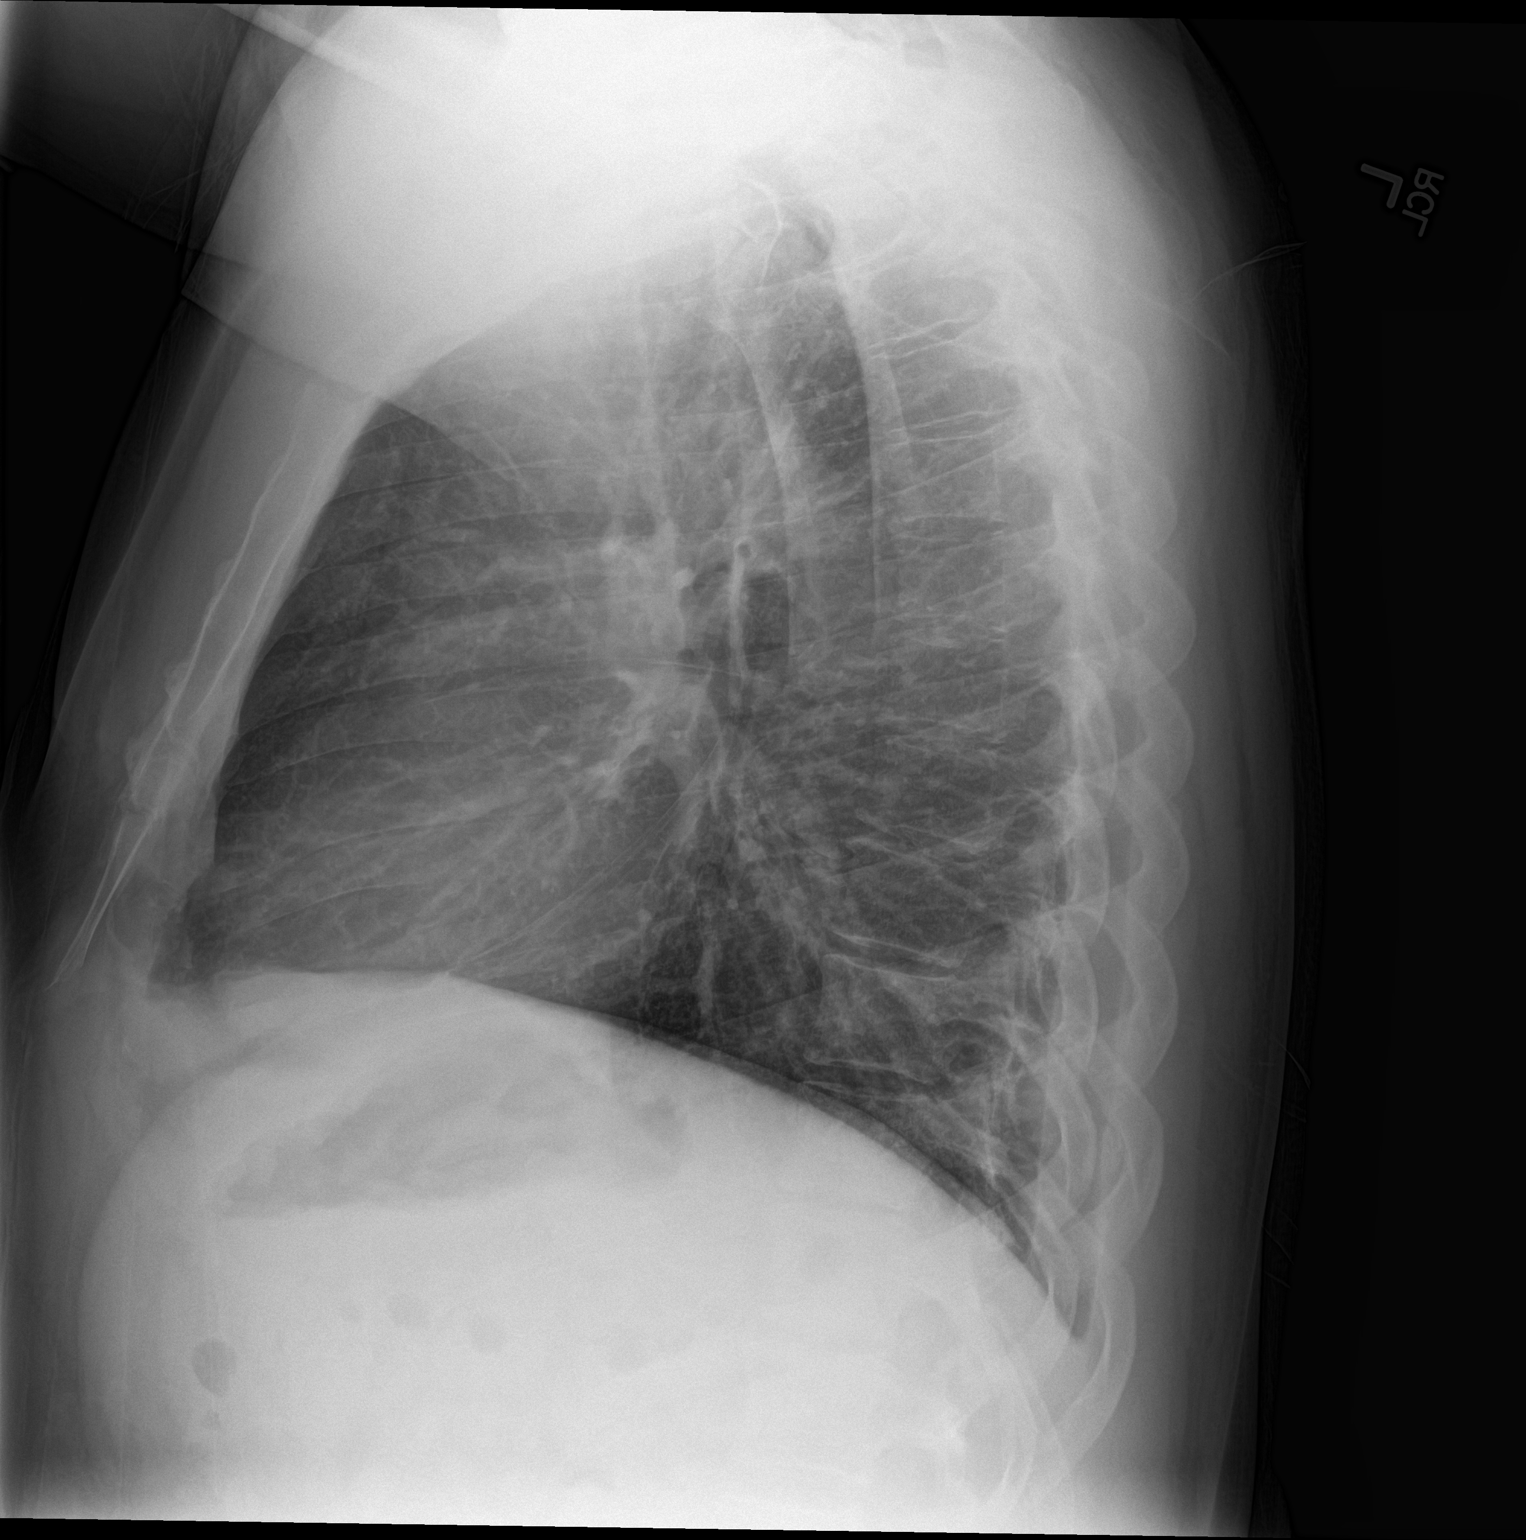

[2 of 2 positions shown; findings below may reference images not displayed]

FINDINGS: The heart size and mediastinal contours are within normal limits.
Both lungs are clear. No pneumothorax or pleural effusion is noted.
The visualized skeletal structures are unremarkable.
IMPRESSION: No active cardiopulmonary disease.

## 2022-04-06 ENCOUNTER — Observation Stay
Admission: EM | Admit: 2022-04-06 | Discharge: 2022-04-07 | Discharge status: Against Medical Advice | Payer: Medicare (Managed Care) | Attending: Internal Medicine | Admitting: Internal Medicine

## 2022-04-06 ENCOUNTER — Emergency Department: Payer: Medicare (Managed Care)

## 2022-04-06 DIAGNOSIS — F17210 Nicotine dependence, cigarettes, uncomplicated: Secondary | ICD-10-CM | POA: Insufficient documentation

## 2022-04-06 DIAGNOSIS — R748 Abnormal levels of other serum enzymes: Secondary | ICD-10-CM | POA: Diagnosis not present

## 2022-04-06 DIAGNOSIS — Z20822 Contact with and (suspected) exposure to covid-19: Secondary | ICD-10-CM | POA: Diagnosis not present

## 2022-04-06 DIAGNOSIS — T402X1A Poisoning by other opioids, accidental (unintentional), initial encounter: Secondary | ICD-10-CM | POA: Diagnosis not present

## 2022-04-06 DIAGNOSIS — J9601 Acute respiratory failure with hypoxia: Secondary | ICD-10-CM | POA: Insufficient documentation

## 2022-04-06 DIAGNOSIS — Z720 Tobacco use: Secondary | ICD-10-CM | POA: Diagnosis present

## 2022-04-06 DIAGNOSIS — K209 Esophagitis, unspecified without bleeding: Secondary | ICD-10-CM | POA: Diagnosis present

## 2022-04-06 DIAGNOSIS — T50901A Poisoning by unspecified drugs, medicaments and biological substances, accidental (unintentional), initial encounter: Secondary | ICD-10-CM

## 2022-04-06 DIAGNOSIS — D72829 Elevated white blood cell count, unspecified: Secondary | ICD-10-CM | POA: Diagnosis not present

## 2022-04-06 DIAGNOSIS — J810 Acute pulmonary edema: Secondary | ICD-10-CM | POA: Insufficient documentation

## 2022-04-06 DIAGNOSIS — R778 Other specified abnormalities of plasma proteins: Secondary | ICD-10-CM | POA: Diagnosis present

## 2022-04-06 LAB — BASIC METABOLIC PANEL
Anion gap: 12 (ref 5–15)
BUN: 16 mg/dL (ref 6–20)
CO2: 24 mmol/L (ref 22–32)
Calcium: 8.9 mg/dL (ref 8.9–10.3)
Chloride: 103 mmol/L (ref 98–111)
Creatinine, Ser: 1.26 mg/dL — ABNORMAL HIGH (ref 0.61–1.24)
GFR, Estimated: >60 mL/min (ref >60)
Glucose, Bld: 160 mg/dL — ABNORMAL HIGH (ref 70–99)
Potassium: 3.8 mmol/L (ref 3.5–5.1)
Sodium: 139 mmol/L (ref 135–145)

## 2022-04-06 LAB — CBC WITH DIFFERENTIAL/PLATELET
Abs Immature Granulocytes: 0.09 10*3/uL — ABNORMAL HIGH (ref 0.00–0.07)
Basophils Absolute: 0.1 10*3/uL (ref 0.0–0.1)
Basophils Relative: 1 %
Eosinophils Absolute: 0.3 10*3/uL (ref 0.0–0.5)
Eosinophils Relative: 2 %
HCT: 51.4 % (ref 39.0–52.0)
Hemoglobin: 17.1 g/dL — ABNORMAL HIGH (ref 13.0–17.0)
Immature Granulocytes: 1 %
Lymphocytes Relative: 34 %
Lymphs Abs: 4.2 10*3/uL — ABNORMAL HIGH (ref 0.7–4.0)
MCH: 31 pg (ref 26.0–34.0)
MCHC: 33.3 g/dL (ref 30.0–36.0)
MCV: 93.3 fL (ref 80.0–100.0)
Monocytes Absolute: 0.8 10*3/uL (ref 0.1–1.0)
Monocytes Relative: 7 %
Neutro Abs: 6.9 10*3/uL (ref 1.7–7.7)
Neutrophils Relative %: 55 %
Platelets: 250 10*3/uL (ref 150–400)
RBC: 5.51 MIL/uL (ref 4.22–5.81)
RDW: 12.3 % (ref 11.5–15.5)
WBC: 12.3 10*3/uL — ABNORMAL HIGH (ref 4.0–10.5)
nRBC: 0 % (ref 0.0–0.2)

## 2022-04-06 LAB — PROCALCITONIN: Procalcitonin: <0.1 ng/mL

## 2022-04-06 LAB — TROPONIN I (HIGH SENSITIVITY): Troponin I (High Sensitivity): 10 ng/L (ref <18)

## 2022-04-06 MED ORDER — NALOXONE HCL 2 MG/2ML IJ SOSY: 0.4000 mg | PREFILLED_SYRINGE | Freq: Once | INTRAMUSCULAR | Status: DC

## 2022-04-06 MED ORDER — NALOXONE HCL 2 MG/2ML IJ SOSY
PREFILLED_SYRINGE | INTRAMUSCULAR | Status: AC
  Filled 2022-04-06: qty 2

## 2022-04-06 MED ORDER — NALOXONE HCL 0.4 MG/ML IJ SOLN
0.2000 mg | Freq: Once | INTRAMUSCULAR | Status: AC
  Administered 2022-04-06: 0.2 mg via INTRAVENOUS

## 2022-04-06 MED ORDER — FUROSEMIDE 10 MG/ML IJ SOLN
40.0000 mg | Freq: Once | INTRAMUSCULAR | Status: AC
  Administered 2022-04-06: 40 mg via INTRAVENOUS
  Filled 2022-04-06: qty 4

## 2022-04-06 NOTE — ED Triage Notes (Signed)
46 y/o male arrived to the [facility] via EMS [facility] with a CC of overdose. EMS states pt was unresponsive and apenic. EMS gave 6 mg of narcan O2 SATs in the 70s. Pt was brought to 93% on 4 LPM [place]. When pt arrived to hospital pt is awake upon verbal arousal and maintaining his own airway at this time.  ?

## 2022-04-06 NOTE — ED Triage Notes (Signed)
EMS states pt states he only had cannabis ?

## 2022-04-06 NOTE — ED Provider Notes (Signed)
? ?[facility] ?Provider Note ? ? ? Event Date/Time  ? First MD Initiated Contact with Patient 04/06/22 2016   ?  (approximate) ? ? ?History  ? ?Drug Overdose ? ? ?HPI ? ?Traivon Morrical is a 46 y.o. male  who, per GI note dated 03/20/15 has history of esophagitis, who presents to the emergency department today because of concern for overdose. The patient was found by first responders and was given 6 mg of narcan before he woke up. He denies any drug use. He does complain of some left sided chest pain. ? ?Physical Exam  ? ?Triage Vital Signs: ?ED Triage Vitals  ?Enc Vitals Group  ?   BP 04/06/22 2016 (!) 126/96  ?   Pulse Rate 04/06/22 2015 87  ?   Resp 04/06/22 2015 18  ?   Temp 04/06/22 2015 98 ?F (36.7 ?C)  ?   Temp Source 04/06/22 2015 Oral  ?   SpO2 04/06/22 2015 (!) 86 %  ? ?Most recent vital signs: ?Vitals:  ? 04/06/22 2016 04/06/22 2017  ?BP: (!) 126/96   ?Pulse:    ?Resp:  (!) 32  ?Temp:    ?SpO2:    ? ? ?General: Somnolent, awakens to verbal stimuli ?CV:  Good peripheral perfusion. Regular rate and rhythm ?Resp:  Normal effort. Some crackles in bases. ?Abd:  No distention. ? ? ?ED Results / Procedures / Treatments  ? ?Labs ?(all labs ordered are listed, but only abnormal results are displayed) ?Labs Reviewed  ?CBC WITH DIFFERENTIAL/PLATELET - Abnormal; Notable for the following components:  ?    Result Value  ? WBC 12.3 (*)   ? Hemoglobin 17.1 (*)   ? Lymphs Abs 4.2 (*)   ? Abs Immature Granulocytes 0.09 (*)   ? All other components within normal limits  ?BASIC METABOLIC PANEL - Abnormal; Notable for the following components:  ? Glucose, Bld 160 (*)   ? Creatinine, Ser 1.26 (*)   ? All other components within normal limits  ?RESP PANEL BY RT-PCR (FLU A&B, COVID) [id number]  ?PROCALCITONIN  ?TROPONIN I (HIGH SENSITIVITY)  ?TROPONIN I (HIGH SENSITIVITY)  ? ? ? ?EKG ? ?IPhineas Semen, attending physician, personally viewed and interpreted this EKG ? ?EKG Time: 2014 ?Rate: 88 ?Rhythm: sinus  rhythm ?Axis: normal ?Intervals: qtc 467 ?QRS: narrow, q waves v1, v2 ?ST changes: no st elevation ?Impression: abnormal ekg ? ? ? ?RADIOLOGY ?I independently interpreted and visualized the cxr . My interpretation: bilateral edema ?Radiology interpretation:  ?IMPRESSION:  ?Asymmetric left greater than right airspace disease which could be  ?secondary to edema or diffuse pulmonary infection.  ? ? ?PROCEDURES: ? ?Critical Care performed: No ? ?Procedures ? ? ?MEDICATIONS ORDERED IN ED: ?Medications - No data to display ? ? ?IMPRESSION / MDM / ASSESSMENT AND PLAN / ED COURSE  ?I reviewed the triage vital signs and the nursing notes. ?             ?               ? ?Differential diagnosis includes, but is not limited to, opioid overdose, post Narcan edema, pneumonia, pneumothorax. ? ?Patient presented to the emergency department today after apparent opioid overdose.  Patient however denied any opioid use to myself although clinically responded to Narcan.  Here in the emergency department he was found to be hypoxic.  Chest x-ray is concerning for edema.  Do have concerns for possible post Narcan pulmonary edema.  Because of  this patient was placed on BiPAP.  We were able to take the patient off of BiPAP in the emergency department and placed on nasal cannula.  Additionally patient was given Lasix.  Discussed findings with patient.  Do think he would benefit from further work-up. ? ? ?FINAL CLINICAL IMPRESSION(S) / ED DIAGNOSES  ? ?Final diagnoses:  ?Acute pulmonary edema (HCC)  ?Accidental overdose, initial encounter  ? ? ? ? ? ? ?Note:  This document was prepared using Dragon voice recognition software and may include unintentional dictation errors. ? ?  ?Phineas Semen, MD ?04/06/22 2346 ? ?

## 2022-04-06 NOTE — ED Notes (Signed)
Bipap placed on patient by RT at this time ?

## 2022-04-07 ENCOUNTER — Encounter: Payer: Self-pay | Admitting: Internal Medicine

## 2022-04-07 DIAGNOSIS — R778 Other specified abnormalities of plasma proteins: Secondary | ICD-10-CM | POA: Diagnosis present

## 2022-04-07 DIAGNOSIS — J9601 Acute respiratory failure with hypoxia: Secondary | ICD-10-CM

## 2022-04-07 DIAGNOSIS — D72829 Elevated white blood cell count, unspecified: Secondary | ICD-10-CM | POA: Diagnosis present

## 2022-04-07 DIAGNOSIS — Z720 Tobacco use: Secondary | ICD-10-CM | POA: Diagnosis present

## 2022-04-07 DIAGNOSIS — R7989 Other specified abnormal findings of blood chemistry: Secondary | ICD-10-CM | POA: Diagnosis present

## 2022-04-07 DIAGNOSIS — K209 Esophagitis, unspecified without bleeding: Secondary | ICD-10-CM | POA: Diagnosis present

## 2022-04-07 LAB — CBC
HCT: 49.2 % (ref 39.0–52.0)
Hemoglobin: 15.9 g/dL (ref 13.0–17.0)
MCH: 29.9 pg (ref 26.0–34.0)
MCHC: 32.3 g/dL (ref 30.0–36.0)
MCV: 92.7 fL (ref 80.0–100.0)
Platelets: 207 10*3/uL (ref 150–400)
RBC: 5.31 MIL/uL (ref 4.22–5.81)
RDW: 12.5 % (ref 11.5–15.5)
WBC: 13.1 10*3/uL — ABNORMAL HIGH (ref 4.0–10.5)
nRBC: 0 % (ref 0.0–0.2)

## 2022-04-07 LAB — RESP PANEL BY RT-PCR (FLU A&B, COVID) ARPGX2
Influenza A by PCR: NEGATIVE
Influenza B by PCR: NEGATIVE
SARS Coronavirus 2 by RT PCR: NEGATIVE

## 2022-04-07 LAB — URINE DRUG SCREEN, QUALITATIVE (ARMC ONLY)
Amphetamines, Ur Screen: NOT DETECTED
Barbiturates, Ur Screen: NOT DETECTED
Benzodiazepine, Ur Scrn: NOT DETECTED
Cannabinoid 50 Ng, Ur ~~LOC~~: POSITIVE — AB
Cocaine Metabolite,Ur ~~LOC~~: POSITIVE — AB
MDMA (Ecstasy)Ur Screen: NOT DETECTED
Methadone Scn, Ur: NOT DETECTED
Opiate, Ur Screen: NOT DETECTED
Phencyclidine (PCP) Ur S: NOT DETECTED
Tricyclic, Ur Screen: NOT DETECTED

## 2022-04-07 LAB — HIV ANTIBODY (ROUTINE TESTING W REFLEX): HIV Screen 4th Generation wRfx: NONREACTIVE

## 2022-04-07 LAB — BASIC METABOLIC PANEL
Anion gap: 7 (ref 5–15)
BUN: 14 mg/dL (ref 6–20)
CO2: 29 mmol/L (ref 22–32)
Calcium: 9 mg/dL (ref 8.9–10.3)
Chloride: 100 mmol/L (ref 98–111)
Creatinine, Ser: 1.12 mg/dL (ref 0.61–1.24)
GFR, Estimated: >60 mL/min (ref >60)
Glucose, Bld: 111 mg/dL — ABNORMAL HIGH (ref 70–99)
Potassium: 4.3 mmol/L (ref 3.5–5.1)
Sodium: 136 mmol/L (ref 135–145)

## 2022-04-07 LAB — PROCALCITONIN: Procalcitonin: <0.1 ng/mL

## 2022-04-07 LAB — TROPONIN I (HIGH SENSITIVITY): Troponin I (High Sensitivity): 39 ng/L — ABNORMAL HIGH (ref <18)

## 2022-04-07 LAB — LACTIC ACID, PLASMA
Lactic Acid, Venous: 2.5 mmol/L (ref 0.5–1.9)
Lactic Acid, Venous: 1.2 mmol/L (ref 0.5–1.9)

## 2022-04-07 MED ORDER — ONDANSETRON HCL 4 MG/2ML IJ SOLN: 4.0000 mg | Freq: Four times a day (QID) | INTRAMUSCULAR | Status: DC | PRN

## 2022-04-07 MED ORDER — ONDANSETRON HCL 4 MG PO TABS: 4.0000 mg | ORAL_TABLET | Freq: Four times a day (QID) | ORAL | Status: DC | PRN

## 2022-04-07 MED ORDER — LORAZEPAM 2 MG/ML IJ SOLN: 2.0000 mg | INTRAMUSCULAR | Status: DC | PRN

## 2022-04-07 MED ORDER — ACETAMINOPHEN 325 MG PO TABS: 650.0000 mg | ORAL_TABLET | Freq: Four times a day (QID) | ORAL | Status: DC | PRN

## 2022-04-07 MED ORDER — NALOXONE HCL 2 MG/2ML IJ SOSY: 0.4000 mg | PREFILLED_SYRINGE | INTRAMUSCULAR | Status: DC | PRN

## 2022-04-07 MED ORDER — NICOTINE 21 MG/24HR TD PT24
21.0000 mg | MEDICATED_PATCH | Freq: Every day | TRANSDERMAL | Status: DC | PRN
  Administered 2022-04-07: 21 mg via TRANSDERMAL
  Filled 2022-04-07: qty 1

## 2022-04-07 MED ORDER — ACETAMINOPHEN 325 MG RE SUPP: 650.0000 mg | Freq: Four times a day (QID) | RECTAL | Status: DC | PRN

## 2022-04-07 MED ORDER — ENOXAPARIN SODIUM 40 MG/0.4ML IJ SOSY: 40.0000 mg | PREFILLED_SYRINGE | Freq: Every day | INTRAMUSCULAR | Status: DC

## 2022-04-07 MED ORDER — HALOPERIDOL LACTATE 5 MG/ML IJ SOLN: 5.0000 mg | Freq: Four times a day (QID) | INTRAMUSCULAR | Status: DC | PRN

## 2022-04-07 NOTE — Progress Notes (Signed)
Cross Cover ?Patient left AMA ?

## 2022-04-07 NOTE — H&P (Addendum)
?History and Physical  ? ?Farris Has IA:[id number] DOB: 1976/10/15 DOA: 04/06/2022 ? ?PCP: Pcp, No  ?Outpatient Specialists: Dr. [facility] ?[facility] via [facility] ? ?I have personally briefly reviewed patient's old medical records in [place]. ? ?Chief Concern: Unresponsiveness ? ?HPI: Mr. Hanish Hamilton is a 46 year old male with history of gastroenteritis who presents from [facility] via EMS for chief concerns of unresponsive and apneic. ? ?Initial vitals in the emergency department showed temperature of 98, respiration rate of 32 and improved to 20, heart rate 83, blood pressure 126/96, SPO2 initially was 88% on BiPAP. ? ?Patient is now improved and required 2 L nasal cannula satting at 93%. ? ?High sensitive troponin was initially 10 and increased to 39. ? ?Procalcitonin was less than 0.10. ? ?WBC was 12.3, hemoglobin 17.1, platelets of 250.   ? ?Serum sodium 139, potassium 3.8, chloride of 103, bicarb 24, nonfasting blood glucose 160, BUN 16, serum creatinine 1.26, GFR greater than 60. ? ?Portable chest x-ray in the ED was read as asymmetric left greater than right airspace disease which could be secondary to edema or diffuse pulmonary infection. ? ?ED treatment: Furosemide 40 mg IV one-time dose, naloxone 0.2 mg IV one-time dose. ? ?At bedside patient is able to tell me his name, age.  He yells at me, "I want to get the fuck out of here.  I do not need to be here.  I want a cigarette." ? ?I attempted to let patient know that his oxygen is dropping and that we cannot get him home with an oxygen tank at this time. ? ?He denies having chest pain.  He states he feels fine right now.  He states that the machine keeps beeping.  I attempted to let him know that is his oxygen dropping whenever he gets angry and he is getting up. ? ?Social history: Unknown. ? ?Vaccination history: Unknown ? ?ROS: Unable to complete as patient does not want to participate ? ?ED Course:  Discussed with emergency medicine provider, patient requiring hospitalization for chief concerns of acute hypoxic respiratory failure ? ?Assessment/Plan ? ?Principal Problem: ?  Acute hypoxemic respiratory failure ([place]) ?Active Problems: ?  Esophagitis ?  Elevated troponin ?  Leukocytosis ?  Tobacco abuse ?  ?Assessment and Plan: ? ?* Acute hypoxemic respiratory failure ([place]) ?- Work-up in progress ?- Check UDS ?- In and out cath ordered to obtain urine ?- If UDS is negative, we will work-up for NSTEMI ?- Naloxone 0.4 mg IV as needed for opioid reversal, 1 dose ordered ?- D-dimer ordered, if positive, we will order a CTA of the chest to assess for PE ?- Discussed with cross coverage provider ? ?Tobacco abuse ?- Nicotine patch as needed ordered ? ?Leukocytosis ?- Query reactive secondary to hypoxic respiratory failure ?- Check lactic acid ? ?Elevated troponin ?- Query secondary to acute hypoxic respiratory failure suspect secondary to drug overdose given patient's response to naloxone ? ?Agitation/anxiety-Ativan 2 mg IV every 2 hours as needed for seizure and anxiety ordered ?- Haldol 5 mg IV every 6 hours as needed for agitation, 1 dose ordered ? ?Chart reviewed.  ? ?DVT prophylaxis: Enoxaparin ?Code Status: Full code ?Diet: Heart healthy ?Family Communication: No ?Disposition Plan: Pending clinical course ?Consults called: None at this time ?Admission status: Progressive, observation ? ?Past Medical History:  ?Diagnosis Date  ? Acid reflux   ? Hemorrhoid   ? ?History reviewed. No pertinent surgical history. ? ?Social History:  reports  that he has been smoking cigarettes. He has been smoking an average of .5 packs per day. He does not have any smokeless tobacco history on file. He reports that he does not drink alcohol and does not use drugs. ? ?No Known Allergies ?History reviewed. No pertinent family history. ?Family history: Family history reviewed and not pertinent ? ?Prior to Admission medications   ?Not on  File  ? ?\Physical Exam: ?Vitals:  ? 04/06/22 2043 04/06/22 2129 04/06/22 2138 04/06/22 2348  ?BP:  112/81 119/79 (!) 124/99  ?Pulse:  90 77 90  ?Resp:  (!) 28 19 20   ?Temp:      ?TempSrc:      ?SpO2: (!) 86% (!) 89% 91% 94%  ? ?Constitutional: appears older than chronological age, NAD, calm, comfortable ?Eyes: PERRL, lids and conjunctivae normal ?ENMT: Mucous membranes are moist. Posterior pharynx clear of any exudate or lesions. Age-appropriate dentition. Hearing appropriate ?Neck: normal, supple, no masses, no thyromegaly ?Respiratory: clear to auscultation bilaterally, no wheezing, no crackles. Normal respiratory effort. No accessory muscle use.  ?Cardiovascular: Regular rate and rhythm, no murmurs / rubs / gallops. No extremity edema. 2+ pedal pulses. No carotid bruits.  ?Abdomen: Obese, no tenderness, no masses palpated, no hepatosplenomegaly. Bowel sounds positive.  ?Musculoskeletal: no clubbing / cyanosis. No joint deformity upper and lower extremities. Good ROM, no contractures, no atrophy. Normal muscle tone.  ?Skin: no rashes, lesions, ulcers. No induration ?Neurologic: Sensation intact. Strength 5/5 in all 4.  ?Psychiatric: Normal judgment and insight. Alert and oriented x 3. Normal mood.  ? ?EKG: independently reviewed, showing sinus rhythm with rate of 88, QTc 467 ? ?Chest x-ray on Admission: I personally reviewed and I agree with radiologist reading as below. ? ?DG Chest Portable 1 View ? ?Result Date: 04/06/2022 ?CLINICAL DATA:  Left-sided chest pain EXAM: PORTABLE CHEST 1 VIEW COMPARISON:  08/20/2016 FINDINGS: Asymmetric left greater than right interstitial and alveolar airspace disease. Borderline cardiomegaly. No pleural effusion or pneumothorax IMPRESSION: Asymmetric left greater than right airspace disease which could be secondary to edema or diffuse pulmonary infection. Electronically Signed   By: Donavan Foil M.D.   On: 04/06/2022 20:54   ? ?Labs on Admission: I have personally reviewed  following labs ? ?CBC: ?Recent Labs  ?Lab 04/06/22 ?2020  ?WBC 12.3*  ?NEUTROABS 6.9  ?HGB 17.1*  ?HCT 51.4  ?MCV 93.3  ?PLT 250  ? ?Basic Metabolic Panel: ?Recent Labs  ?Lab 04/06/22 ?2020  ?NA 139  ?K 3.8  ?CL 103  ?CO2 24  ?GLUCOSE 160*  ?BUN 16  ?CREATININE 1.26*  ?CALCIUM 8.9  ? ?GFR: ?CrCl cannot be calculated (Unknown ideal weight.). ? ?Dr. Tobie Poet ?[facility] ? ?If 7PM-7AM, please contact overnight-coverage provider ?If 7AM-7PM, please contact day coverage provider ?[email or web address] ? ?04/07/2022, 1:32 AM  ? ?

## 2022-04-07 NOTE — Assessment & Plan Note (Signed)
-   Query secondary to acute hypoxic respiratory failure suspect secondary to drug overdose given patient's response to naloxone ?

## 2022-04-07 NOTE — Assessment & Plan Note (Signed)
-   Query reactive secondary to hypoxic respiratory failure ?- Check lactic acid ?

## 2022-04-07 NOTE — ED Notes (Signed)
Pt states he needs to go home to retreive [place] personal items [place] because there is a chance his belongings can be stolen from when he came to the hospital. This nurse expresses that he needs to stay in the hospital for additional testing and it would benefit his health if he stayed. Also that if he does leave he could get sicker and get in worse condition. Pt stated he needs and wants to leave. NP Jon Billings notified  ?

## 2022-04-07 NOTE — Hospital Course (Addendum)
Mr. Peter Kim is a 46 year old male with history of gastroenteritis who presents from [facility] via EMS for chief concerns of unresponsive and apneic. ? ?Initial vitals in the emergency department showed temperature of 98, respiration rate of 32 and improved to 20, heart rate 83, blood pressure 126/96, SPO2 initially was 88% on BiPAP. ? ?Patient is now improved and required 2 L nasal cannula satting at 93%. ? ?High sensitive troponin was initially 10 and increased to 39. ? ?Procalcitonin was less than 0.10. ? ?WBC was 12.3, hemoglobin 17.1, platelets of 250.   ? ?Serum sodium 139, potassium 3.8, chloride of 103, bicarb 24, nonfasting blood glucose 160, BUN 16, serum creatinine 1.26, GFR greater than 60. ? ?Portable chest x-ray in the ED was read as asymmetric left greater than right airspace disease which could be secondary to edema or diffuse pulmonary infection. ? ?ED treatment: Furosemide 40 mg IV one-time dose, naloxone 0.2 mg IV one-time dose. ?

## 2022-04-07 NOTE — Assessment & Plan Note (Addendum)
-   Work-up in progress ?- Check UDS ?- In and out cath ordered to obtain urine ?- If UDS is negative, we will work-up for NSTEMI ?- Naloxone 0.4 mg IV as needed for opioid reversal, 1 dose ordered ?- D-dimer ordered, if positive, we will order a CTA of the chest to assess for PE ?- Discussed with cross coverage provider ?

## 2022-04-07 NOTE — Assessment & Plan Note (Signed)
-   Nicotine patch as needed ordered ?

## 2022-04-20 ENCOUNTER — Emergency Department
Admission: EM | Admit: 2022-04-20 | Discharge: 2022-04-21 | Disposition: A | Discharge status: Home / Self Care | Payer: Medicare (Managed Care) | Attending: Emergency Medicine | Admitting: Emergency Medicine

## 2022-04-20 ENCOUNTER — Emergency Department: Payer: Medicare (Managed Care)

## 2022-04-20 ENCOUNTER — Other Ambulatory Visit: Payer: Self-pay

## 2022-04-20 DIAGNOSIS — R0602 Shortness of breath: Secondary | ICD-10-CM | POA: Insufficient documentation

## 2022-04-20 DIAGNOSIS — R0789 Other chest pain: Secondary | ICD-10-CM

## 2022-04-20 DIAGNOSIS — M7989 Other specified soft tissue disorders: Secondary | ICD-10-CM

## 2022-04-20 DIAGNOSIS — Z765 Malingerer [conscious simulation]: Secondary | ICD-10-CM

## 2022-04-20 LAB — CBC WITH DIFFERENTIAL/PLATELET
Abs Immature Granulocytes: 0.02 10*3/uL (ref 0.00–0.07)
Basophils Absolute: 0 10*3/uL (ref 0.0–0.1)
Basophils Relative: 1 %
Eosinophils Absolute: 0.1 10*3/uL (ref 0.0–0.5)
Eosinophils Relative: 1 %
HCT: 47.7 % (ref 39.0–52.0)
Hemoglobin: 15.2 g/dL (ref 13.0–17.0)
Immature Granulocytes: 0 %
Lymphocytes Relative: 18 %
Lymphs Abs: 1.4 10*3/uL (ref 0.7–4.0)
MCH: 30.2 pg (ref 26.0–34.0)
MCHC: 31.9 g/dL (ref 30.0–36.0)
MCV: 94.6 fL (ref 80.0–100.0)
Monocytes Absolute: 0.8 10*3/uL (ref 0.1–1.0)
Monocytes Relative: 10 %
Neutro Abs: 5.3 10*3/uL (ref 1.7–7.7)
Neutrophils Relative %: 70 %
Platelets: 232 10*3/uL (ref 150–400)
RBC: 5.04 MIL/uL (ref 4.22–5.81)
RDW: 12.6 % (ref 11.5–15.5)
WBC: 7.7 10*3/uL (ref 4.0–10.5)
nRBC: 0 % (ref 0.0–0.2)

## 2022-04-20 LAB — COMPREHENSIVE METABOLIC PANEL
ALT: 23 U/L (ref 0–44)
AST: 19 U/L (ref 15–41)
Albumin: 4 g/dL (ref 3.5–5.0)
Alkaline Phosphatase: 56 U/L (ref 38–126)
Anion gap: 10 (ref 5–15)
BUN: 13 mg/dL (ref 6–20)
CO2: 22 mmol/L (ref 22–32)
Calcium: 9 mg/dL (ref 8.9–10.3)
Chloride: 106 mmol/L (ref 98–111)
Creatinine, Ser: 1.09 mg/dL (ref 0.61–1.24)
GFR, Estimated: >60 mL/min (ref >60)
Glucose, Bld: 95 mg/dL (ref 70–99)
Potassium: 3.8 mmol/L (ref 3.5–5.1)
Sodium: 138 mmol/L (ref 135–145)
Total Bilirubin: 0.7 mg/dL (ref 0.3–1.2)
Total Protein: 7.4 g/dL (ref 6.5–8.1)

## 2022-04-20 MED ORDER — KETOROLAC TROMETHAMINE 30 MG/ML IJ SOLN
30.0000 mg | Freq: Once | INTRAMUSCULAR | Status: AC
  Administered 2022-04-21: 30 mg via INTRAVENOUS
  Filled 2022-04-20: qty 1

## 2022-04-20 NOTE — ED Triage Notes (Addendum)
Reports shob ongoing since he was seen here on 04/06/22. Signed out AMA and wants to resume care. Reports fluid on chest and lungs. Reports being given bad norcos with fentanyl recently and fell down and ever since his knees have been swelling and pain in the left ribs.  ?

## 2022-04-20 NOTE — ED Provider Notes (Signed)
? ?[facility] ?Provider Note ? ? ? Event Date/Time  ? First MD Initiated Contact with Patient 04/20/22 2344   ?  (approximate) ? ? ?History  ? ?Shortness of Breath ? ? ?HPI ? ?Peter Kim is a 46 y.o. male with history of opiate abuse who presents to the emergency department complaints of left-sided chest pain.  Patient states that he was here at the beginning of April after an unintentional overdose and was found to have acute pulmonary edema and was hypoxic.  Was placed on BiPAP but then left the hospital AGAINST MEDICAL ADVICE.  As he is here because he feels like he still has fluid on his lungs.  States he feels a lump in the left side of his chest and has pain there worse with palpation, deep inspiration and feels like there is fluid in his lungs especially when he bends over.  He also feels like his legs have been swollen bilaterally but no calf tenderness.  No history of PE or DVT.  No history of CAD.  No injury to the chest. ? ? ?History provided by patient. ? ? ? ?Past Medical History:  ?Diagnosis Date  ? Acid reflux   ? Hemorrhoid   ? ? ?History reviewed. No pertinent surgical history. ? ?MEDICATIONS:  ?Prior to Admission medications   ?Not on File  ? ? ?Physical Exam  ? ?Triage Vital Signs: ?ED Triage Vitals  ?Enc Vitals Group  ?   BP 04/20/22 2145 (!) 153/110  ?   Pulse Rate 04/20/22 2145 80  ?   Resp 04/20/22 2145 20  ?   Temp 04/20/22 2145 98 ?F (36.7 ?C)  ?   Temp Source 04/20/22 2145 Oral  ?   SpO2 04/20/22 2145 95 %  ?   Weight 04/20/22 2147 230 lb (104.3 kg)  ?   Height 04/20/22 2147 6\' 1"  (1.854 m)  ?   Head Circumference --   ?   Peak Flow --   ?   Pain Score 04/20/22 2147 0  ?   Pain Loc --   ?   Pain Edu? --   ?   Excl. in GC? --   ? ? ?Most recent vital signs: ?Vitals:  ? 04/21/22 0003 04/21/22 0137  ?BP: 138/89 (!) 133/91  ?Pulse: 84 77  ?Resp: 20 14  ?Temp:    ?SpO2: 97% 94%  ? ? ?CONSTITUTIONAL: Alert and oriented and responds appropriately to questions.   Chronically ill-appearing ?HEAD: Normocephalic, atraumatic ?EYES: Conjunctivae clear, pupils appear equal, sclera nonicteric ?ENT: normal nose; moist mucous membranes ?NECK: Supple, normal ROM ?CARD: RRR; S1 and S2 appreciated; no murmurs, no clicks, no rubs, no gallops ?CHEST:  Chest wall is tender to palpation.  No crepitus, ecchymosis, erythema, warmth, rash or other lesions present.  I do not appreciate any mass in the chest wall but exam is limited as patient is unable to tolerate me palpating the left chest wall. ?RESP: Normal chest excursion without splinting or tachypnea; breath sounds clear and equal bilaterally; no wheezes, no rhonchi, no rales, no hypoxia or respiratory distress, speaking full sentences ?ABD/GI: Normal bowel sounds; non-distended; soft, non-tender, no rebound, no guarding, no peritoneal signs ?BACK: The back appears normal ?EXT: Normal ROM in all joints; no deformity noted, trace edema in bilateral lower extremities, no calf tenderness ?SKIN: Normal color for age and race; warm; no rash on exposed skin ?NEURO: Moves all extremities equally, normal speech ?PSYCH: The patient's mood and manner  are appropriate. ? ? ?ED Results / Procedures / Treatments  ? ?LABS: ?(all labs ordered are listed, but only abnormal results are displayed) ?Labs Reviewed  ?CBC WITH DIFFERENTIAL/PLATELET  ?COMPREHENSIVE METABOLIC PANEL  ?BRAIN NATRIURETIC PEPTIDE  ?TROPONIN I (HIGH SENSITIVITY)  ?TROPONIN I (HIGH SENSITIVITY)  ? ? ? ?EKG: ? EKG Interpretation ? ?Date/Time:  Saturday April 20 2022 21:49:57 EDT ?Ventricular Rate:  82 ?PR Interval:  172 ?QRS Duration: 90 ?QT Interval:  378 ?QTC Calculation: 441 ?R Axis:   48 ?Text Interpretation: Normal sinus rhythm Cannot rule out Anterior infarct , age undetermined Abnormal ECG When compared with ECG of 06-Apr-2022 20:14, PREVIOUS ECG IS PRESENT Confirmed by Rochele RaringWard, Niccole Witthuhn [phone]([id number]) on 04/20/2022 11:52:19 PM ?  ? ?  ? ? ? ?RADIOLOGY: ?My personal review and  interpretation of imaging: Chest x-ray clear.  CTA shows no PE, edema, and pneumonia. ? ?I have personally reviewed all radiology reports.   ?DG Chest 2 View ? ?Result Date: 04/20/2022 ?CLINICAL DATA:  Shortness of breath EXAM: CHEST - 2 VIEW COMPARISON:  04/06/2022 FINDINGS: Heart and mediastinal contours are within normal limits. No focal opacities or effusions. No acute bony abnormality. IMPRESSION: No active cardiopulmonary disease. Electronically Signed   By: Charlett NoseKevin  Dover M.D.   On: 04/20/2022 22:04  ? ?CT Angio Chest PE W and/or Wo Contrast ? ?Result Date: 04/21/2022 ?CLINICAL DATA:  Shortness of breath, left chest wall palpable abnormality EXAM: CT ANGIOGRAPHY CHEST WITH CONTRAST TECHNIQUE: Multidetector CT imaging of the chest was performed using the standard protocol during bolus administration of intravenous contrast. Multiplanar CT image reconstructions and MIPs were obtained to evaluate the vascular anatomy. RADIATION DOSE REDUCTION: This exam was performed according to the departmental dose-optimization program which includes automated exposure control, adjustment of the mA and/or kV according to patient size and/or use of iterative reconstruction technique. CONTRAST:  50mL OMNIPAQUE IOHEXOL 350 MG/ML SOLN COMPARISON:  Chest radiograph dated 04/20/2022 FINDINGS: Cardiovascular: Satisfactory opacification the bilateral pulmonary arteries to the segmental level. No evidence of pulmonary embolism. Although not tailored for evaluation of the thoracic aorta, there is no evidence thoracic aortic aneurysm or dissection. Heart is normal in size.  No pericardial effusion. Age advanced coronary atherosclerosis of the LAD. Mediastinum/Nodes: No suspicious mediastinal lymphadenopathy. Visualized thyroid is unremarkable. Lungs/Pleura: Lungs are essentially clear. Mild scattered atelectasis with lower lobe mosaic attenuation. No focal consolidation. No suspicious pulmonary nodules. No pleural effusion or pneumothorax.  Upper Abdomen: Visualized upper abdomen is grossly unremarkable. Musculoskeletal: Visualized osseous structures are within normal limits. Review of the MIP images confirms the above findings. IMPRESSION: No evidence of pulmonary embolism. Age advanced coronary atherosclerosis of the LAD. Electronically Signed   By: Charline BillsSriyesh  Krishnan M.D.   On: 04/21/2022 00:46   ? ? ?PROCEDURES: ? ?Critical Care performed: No ? ? ? ? ?.1-3 Lead EKG Interpretation ?Performed by: Alica Shellhammer, Layla MawKristen N, DO ?Authorized by: Liliana Dang, Layla MawKristen N, DO  ? ?  Interpretation: normal   ?  ECG rate:  85 ?  ECG rate assessment: normal   ?  Rhythm: sinus rhythm   ?  Ectopy: none   ?  Conduction: normal   ? ? ? ?IMPRESSION / MDM / ASSESSMENT AND PLAN / ED COURSE  ?I reviewed the triage vital signs and the nursing notes. ? ? ? ?Patient here with chest pain, shortness of breath, lower extremity swelling. ? ?The patient is on the cardiac monitor to evaluate for evidence of arrhythmia and/or significant heart rate changes. ? ? ?DIFFERENTIAL  DIAGNOSIS (includes but not limited to):   ACS, PE, dissection, CHF, pneumonia, COPD, pneumothorax, musculoskeletal pain, rib fracture ? ? ?PLAN: We will obtain CBC, BMP, troponin, BNP, chest x-ray, EKG.  Given I suspect there is some musculoskeletal component to his pain and he has a history of opiate overdose, will give Toradol for pain control here. ? ? ?MEDICATIONS GIVEN IN ED: ?Medications  ?furosemide (LASIX) injection 20 mg (has no administration in time range)  ?ketorolac (TORADOL) 30 MG/ML injection 30 mg (30 mg Intravenous Given 04/21/22 0015)  ?iohexol (OMNIPAQUE) 350 MG/ML injection 50 mL (50 mLs Intravenous Contrast Given 04/21/22 0027)  ? ? ? ?ED COURSE: EKG is nonischemic.  No leukocytosis.  Normal hemoglobin.  Normal electrolytes.  First troponin negative.  BNP normal.  Chest x-ray reviewed by myself and radiologist and shows no infiltrate, edema or pneumothorax.  We will proceed with CTA of the chest. ? ? ?1:00  AM  CTA reviewed by myself and radiologist and shows no PE, rib fracture, chest wall mass or other acute abnormality.  Second troponin pending.  He is requesting a fluid pill for the swelling in his legs.  Swelling i

## 2022-04-20 NOTE — ED Notes (Signed)
Lab called to add on Trop and BNP to previous collection. Pt visualized ambulating around the lobby without difficulty at this time.  ?

## 2022-04-21 ENCOUNTER — Emergency Department: Payer: Medicare (Managed Care)

## 2022-04-21 LAB — BRAIN NATRIURETIC PEPTIDE: B Natriuretic Peptide: 18.6 pg/mL (ref 0.0–100.0)

## 2022-04-21 LAB — TROPONIN I (HIGH SENSITIVITY)
Troponin I (High Sensitivity): 4 ng/L (ref <18)
Troponin I (High Sensitivity): 3 ng/L (ref <18)

## 2022-04-21 MED ORDER — IOHEXOL 350 MG/ML SOLN
50.0000 mL | Freq: Once | INTRAVENOUS | Status: AC | PRN
  Administered 2022-04-21: 50 mL via INTRAVENOUS

## 2022-04-21 MED ORDER — FUROSEMIDE 10 MG/ML IJ SOLN
20.0000 mg | Freq: Once | INTRAMUSCULAR | Status: AC
  Administered 2022-04-21: 20 mg via INTRAMUSCULAR
  Filled 2022-04-21: qty 4

## 2022-04-21 NOTE — Discharge Instructions (Signed)
You may alternate Tylenol 1000 mg every 6 hours as needed for pain, fever and Ibuprofen 800 mg every 6-8 hours as needed for pain, fever.  Please take Ibuprofen with food.  Do not take more than 4000 mg of Tylenol (acetaminophen) in a 24 hour period. ? ? ?Your cardiac work-up today was reassuring.  CT scan showed no fluid on your lungs, pneumonia, rib fracture, chest wall mass.  Your labs showed no sign of heart attack today.  Your EKG showed no abnormality.  I suspect that your pain is musculoskeletal.  As for the swelling in your legs, I suspect this is due to standing on your feet for long periods of time.  I recommend keeping your legs elevated when at rest and wearing compression stockings while at work.  You do not need a diuretic today. ? ? ?Steps to find a Primary Care Provider (PCP): ? ?Call [phone] or [phone] to access "[place] Find a Doctor Service." ? ?2.  You may also go on the [facility] website at [email or web address] ? ?

## 2022-04-21 NOTE — ED Notes (Signed)
During d/c pt began cussing and complained that his left knee pain was not addressed stating that the main reason he came in was for his knee pain.  PT stated that he has been given a prescription for tramadol and lasix in the past to help with knee pain.  Stated that he believed he had fluid on his knee.  EDP Ward notified and offered xray of knee and IM lasix.  Pt refused offer for Xray and stated "I'm 46 years old, I know my body." Pt educated that he needed to see his primary physician for these issues and pt declined. ?

## 2022-04-25 ENCOUNTER — Emergency Department: Payer: Medicare (Managed Care)

## 2022-04-25 ENCOUNTER — Encounter: Payer: Self-pay | Admitting: Emergency Medicine

## 2022-04-25 ENCOUNTER — Emergency Department
Admission: EM | Admit: 2022-04-25 | Discharge: 2022-04-25 | Disposition: A | Discharge status: Home / Self Care | Payer: Medicare (Managed Care) | Attending: Emergency Medicine | Admitting: Emergency Medicine

## 2022-04-25 ENCOUNTER — Other Ambulatory Visit: Payer: Self-pay

## 2022-04-25 DIAGNOSIS — Y9301 Activity, walking, marching and hiking: Secondary | ICD-10-CM | POA: Diagnosis not present

## 2022-04-25 DIAGNOSIS — M25562 Pain in left knee: Secondary | ICD-10-CM | POA: Diagnosis present

## 2022-04-25 DIAGNOSIS — M25462 Effusion, left knee: Secondary | ICD-10-CM | POA: Diagnosis not present

## 2022-04-25 DIAGNOSIS — Y990 Civilian activity done for income or pay: Secondary | ICD-10-CM | POA: Diagnosis not present

## 2022-04-25 MED ORDER — TRAMADOL HCL 50 MG PO TABS
50.0000 mg | ORAL_TABLET | Freq: Once | ORAL | Status: AC
  Administered 2022-04-25: 50 mg via ORAL
  Filled 2022-04-25: qty 1

## 2022-04-25 MED ORDER — CYCLOBENZAPRINE HCL 5 MG PO TABS: 5.0000 mg | ORAL_TABLET | Freq: Three times a day (TID) | ORAL | 0 refills | Status: AC | PRN

## 2022-04-25 MED ORDER — NABUMETONE 750 MG PO TABS: 750.0000 mg | ORAL_TABLET | Freq: Two times a day (BID) | ORAL | 0 refills | Status: AC

## 2022-04-25 NOTE — ED Triage Notes (Signed)
Pt comes into the ED via POV c/o left knee pain and swelling.  Pt states he has had to have fluid drained off his knees multiple times.  Denies any injury and patient is able to ambulate well into triage.  ?

## 2022-04-25 NOTE — ED Provider Notes (Signed)
? ? ?[facility] ?Emergency Department Provider Note ? ? ? ? Event Date/Time  ? First MD Initiated Contact with Patient 04/25/22 1547   ?  (approximate) ? ? ?History  ? ?Knee Pain ? ? ?HPI ? ?Peter Kim is a 46 y.o. male presents to the ED with nontraumatic left knee pain and swelling.  Patient presents to the ED with swelling and tightness to the left knee.  He denies any recent injury or trauma.  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  He denies any history of gout or arthritis.  Denies any previous knee effusion, joint aspirations, or treatment for arthritis. ? ?Physical Exam  ? ?Triage Vital Signs: ?ED Triage Vitals  ?Enc Vitals Group  ?   BP 04/25/22 1605 130/88  ?   Pulse Rate 04/25/22 1605 72  ?   Resp 04/25/22 1605 16  ?   Temp 04/25/22 1605 98.6 ?F (37 ?C)  ?   Temp Source 04/25/22 1605 Oral  ?   SpO2 04/25/22 1605 95 %  ?   Weight 04/25/22 1433 230 lb (104.3 kg)  ?   Height 04/25/22 1433 6\' 1"  (1.854 m)  ?   Head Circumference --   ?   Peak Flow --   ?   Pain Score 04/25/22 1432 9  ?   Pain Loc --   ?   Pain Edu? --   ?   Excl. in GC? --   ? ? ?Most recent vital signs: ?Vitals:  ? 04/25/22 1605  ?BP: 130/88  ?Pulse: 72  ?Resp: 16  ?Temp: 98.6 ?F (37 ?C)  ?SpO2: 95%  ? ? ?General Awake, no distress.  ?CV:  Good peripheral perfusion.  ?RESP:  Normal effort.  ?ABD:  No distention.  ?MSK:  Left knee with some moderate effusion noted.  Skin is warm, dry, and intact superficially.  Patient with normal active range of motion on exam.  Normal patellar tracking noted.  No significant valgus or varus when stress is elicited.  No popliteal space fullness noted.  No calf or calf tenderness distally. ? ? ?ED Results / Procedures / Treatments  ? ?Labs ?(all labs ordered are listed, but only abnormal results are displayed) ?Labs Reviewed - No data to display ? ? ?EKG ? ? ?RADIOLOGY ? ?I personally viewed and evaluated these images as part of my medical  decision making, as well as reviewing the written report by the radiologist. ? ?ED Provider Interpretation: no acute findings} ? ?DG Knee Complete 4 Views Left ? ?Result Date: 04/25/2022 ?CLINICAL DATA:  Left knee pain. EXAM: LEFT KNEE - COMPLETE 4+ VIEW COMPARISON:  None. FINDINGS: No acute fracture or dislocation. No aggressive osseous lesion. Normal alignment. Large joint effusion. Soft tissue are unremarkable. No radiopaque foreign body or soft tissue emphysema. IMPRESSION: 1. No acute osseous injury of the left knee. 2. Nonspecific, large left knee joint effusion. Electronically Signed   By: 04/27/2022 M.D.   On: 04/25/2022 15:08   ? ? ?PROCEDURES: ? ?Critical Care performed: No ? ?Procedures ? ? ?MEDICATIONS ORDERED IN ED: ?Medications  ?traMADol (ULTRAM) tablet 50 mg (50 mg Oral Given 04/25/22 1655)  ? ? ? ?IMPRESSION / MDM / ASSESSMENT AND PLAN / ED COURSE  ?I reviewed the triage vital signs and the nursing notes. ?             ?               ? ?  Differential diagnosis includes, but is not limited to, knee sprain, knee contusion, DJD, gout  ? ?Patient to the ED for evaluation of acute left knee pain and swelling.  Patient presents with nontraumatic knee pain for evaluation.  His exam is reassuring as it shows a mild effusion to the left knee without any increased joint warmth or tenderness.  Normal active range of motion is appreciated.  Radiologic evidence of any acute fracture or dislocation.  Patient wears patient's diagnosis is consistent with left knee sprain with effusion. Patient will be discharged home with prescriptions for Relafen and Flexeril. Patient is to follow up with Ortho or local community health department as needed or otherwise directed. Patient is given ED precautions to return to the ED for any worsening or new symptoms. ? ? ?FINAL CLINICAL IMPRESSION(S) / ED DIAGNOSES  ? ?Final diagnoses:  ?Effusion of left knee  ? ? ? ?Rx / DC Orders  ? ?ED Discharge Orders   ? ?      Ordered  ?   nabumetone (RELAFEN) 750 MG tablet  2 times daily       ? 04/25/22 1638  ?  cyclobenzaprine (FLEXERIL) 5 MG tablet  3 times daily PRN       ? 04/25/22 1638  ? ?  ?  ? ?  ? ? ? ?Note:  This document was prepared using Dragon voice recognition software and may include unintentional dictation errors. ? ?  ?Lissa Hoard, PA-C ?04/25/22 2357 ? ?  ?Concha Se, MD ?04/26/22 1513 ? ?

## 2022-04-25 NOTE — Discharge Instructions (Addendum)
Your exam and XR shows a normal knee without significant arthritis. You have some fluid around the knee, which can cause pain and difficulty. Rest with the leg elevated to reduce swelling. Take the prescription meds as directed. Follow-up with Ortho or a local community clinic for continued symptoms.  ?

## 2023-03-19 DIAGNOSIS — F410 Panic disorder [episodic paroxysmal anxiety] without agoraphobia: Secondary | ICD-10-CM | POA: Insufficient documentation

## 2023-03-19 DIAGNOSIS — F4310 Post-traumatic stress disorder, unspecified: Secondary | ICD-10-CM | POA: Insufficient documentation

## 2023-03-19 DIAGNOSIS — K047 Periapical abscess without sinus: Secondary | ICD-10-CM | POA: Insufficient documentation

## 2023-11-03 DIAGNOSIS — M25561 Pain in right knee: Secondary | ICD-10-CM | POA: Insufficient documentation

## 2023-11-03 DIAGNOSIS — K649 Unspecified hemorrhoids: Secondary | ICD-10-CM | POA: Insufficient documentation

## 2024-04-08 ENCOUNTER — Other Ambulatory Visit: Payer: Self-pay

## 2024-06-02 NOTE — Progress Notes (Unsigned)
 PATIENT NO SHOW FOR APPOINTMENT        [address]       [place] [address]             [phone]        Peter Kim [phone] [birth year]  History of Present Illness:  Peter Kim is a 48 yo male with known history of PTSD.  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        Current Outpatient Medications on File Prior to Visit  Medication Sig Dispense Refill   ARIPiprazole (ABILIFY) 10 MG tablet Take 10 mg by mouth daily.     cyclobenzaprine  (FLEXERIL ) 5 MG tablet Take 1 tablet (5 mg total) by mouth 3 (three) times daily as needed. 15 tablet 0   DULoxetine (CYMBALTA) 20 MG capsule Take 20 mg by mouth daily.     hydrOXYzine (VISTARIL) 25 MG capsule Take by mouth.     ibuprofen (ADVIL) 600 MG tablet Take by mouth.     methylPREDNISolone (MEDROL DOSEPAK) 4 MG TBPK tablet Take as directed on the package     oxyCODONE  (OXY IR/ROXICODONE ) 5 MG immediate release tablet Take 1 to 3 tabs every 4-6 hours as needed for pain. Decrease use as pain decreases.  Do not drive while taking this medication.     No current facility-administered medications on file prior to visit.     There were no vitals taken for this visit.  Physical Exam  CTA Results:  Outside films... 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  Despite Medical intervention if the patient notices persistently elevated blood pressure readings.  They are instructed to contact their Primary Care Physician  Please avoid use of Fluoroquinolones as this can potentially increase your risk of Aortic Rupture and/or Dissection  Patient educated on signs and symptoms of Aortic Dissection, handout also  provided in AVS  Peter Grether, PA-C 06/02/24

## 2024-06-02 NOTE — Patient Instructions (Signed)
 Patient is counseled regarding the importance of long term risk factor modification as they pertain to the presence of ischemic heart disease including avoiding the use of all tobacco products, dietary modifications and medical therapy for diabetes, cholesterol and lipid management, and regular exercise.    Blood pressure control is very important, ideally less than 140/80

## 2024-06-15 ENCOUNTER — Ambulatory Visit: Payer: Medicare (Managed Care) | Admitting: Physician Assistant

## 2025-01-05 NOTE — Progress Notes (Signed)
 PATIENT CANCEL.
